# Patient Record
Sex: Male | Born: 1960 | Race: Black or African American | Hispanic: No | Marital: Married | Smoking: Current every day smoker
Health system: Southern US, Community
[De-identification: ages and names within clinical notes are randomized; demographics above are authoritative.]

## PROBLEM LIST (undated history)

## (undated) DIAGNOSIS — M545 Low back pain, unspecified: Secondary | ICD-10-CM

## (undated) DIAGNOSIS — I10 Essential (primary) hypertension: Secondary | ICD-10-CM

## (undated) DIAGNOSIS — E119 Type 2 diabetes mellitus without complications: Secondary | ICD-10-CM

## (undated) DIAGNOSIS — E785 Hyperlipidemia, unspecified: Secondary | ICD-10-CM

## (undated) DIAGNOSIS — K219 Gastro-esophageal reflux disease without esophagitis: Secondary | ICD-10-CM

## (undated) HISTORY — DX: Low back pain, unspecified: M54.50

## (undated) HISTORY — DX: Hyperlipidemia, unspecified: E78.5

## (undated) HISTORY — DX: Essential (primary) hypertension: I10

## (undated) HISTORY — DX: Low back pain: M54.5

---

## 2005-01-01 ENCOUNTER — Ambulatory Visit: Payer: Self-pay | Admitting: Internal Medicine

## 2005-08-16 ENCOUNTER — Ambulatory Visit: Payer: Self-pay | Admitting: Internal Medicine

## 2009-02-28 ENCOUNTER — Emergency Department: Payer: Self-pay | Admitting: Internal Medicine

## 2010-03-16 ENCOUNTER — Ambulatory Visit: Payer: Self-pay | Admitting: Internal Medicine

## 2011-02-02 ENCOUNTER — Other Ambulatory Visit: Payer: Self-pay

## 2011-03-15 ENCOUNTER — Ambulatory Visit: Payer: Self-pay

## 2012-12-31 ENCOUNTER — Emergency Department: Payer: Self-pay | Admitting: Emergency Medicine

## 2012-12-31 LAB — BASIC METABOLIC PANEL
BUN: 11 mg/dL (ref 7–18)
Glucose: 88 mg/dL (ref 65–99)
Osmolality: 276 (ref 275–301)

## 2012-12-31 LAB — URINALYSIS, COMPLETE
Ketone: NEGATIVE
Leukocyte Esterase: NEGATIVE
Ph: 6 (ref 4.5–8.0)
Protein: NEGATIVE
RBC,UR: <1 /HPF (ref 0–5)
WBC UR: <1 /HPF (ref 0–5)

## 2012-12-31 LAB — CBC
HGB: 14.1 g/dL (ref 13.0–18.0)
MCH: 28.1 pg (ref 26.0–34.0)
MCHC: 33.5 g/dL (ref 32.0–36.0)
RBC: 5.01 10*6/uL (ref 4.40–5.90)
RDW: 14 % (ref 11.5–14.5)
WBC: 8.5 10*3/uL (ref 3.8–10.6)

## 2012-12-31 LAB — TROPONIN I: Troponin-I: <0.02 ng/mL

## 2013-01-09 ENCOUNTER — Ambulatory Visit: Payer: Self-pay | Admitting: Adult Health

## 2013-01-11 ENCOUNTER — Ambulatory Visit: Payer: Self-pay | Admitting: General Practice

## 2013-02-01 ENCOUNTER — Encounter: Payer: Self-pay | Admitting: Pulmonary Disease

## 2013-02-05 ENCOUNTER — Institutional Professional Consult (permissible substitution): Payer: Self-pay | Admitting: Pulmonary Disease

## 2013-02-08 ENCOUNTER — Encounter: Payer: Self-pay | Admitting: Pulmonary Disease

## 2015-07-13 IMAGING — CR DG CHEST 2V
1 series · 2 of 2 positions shown · non-contrast
Comparison: none

REASON FOR EXAM: Fax results [id number] ? nodule R lung
COMMENTS:

PROCEDURE:     [facility] - KDXR CHEST PA (OR AP) AND LAT  - January 11, 2013  [DATE]
RESULT:     The lungs are clear. The heart and pulmonary vessels are normal.
The bony and mediastinal structures are unremarkable. There is no effusion.
There is no pneumothorax or evidence of congestive failure.

[Series 1: pa · 0.17mm/px · 2 of 2 slices shown]
[im 1/2]
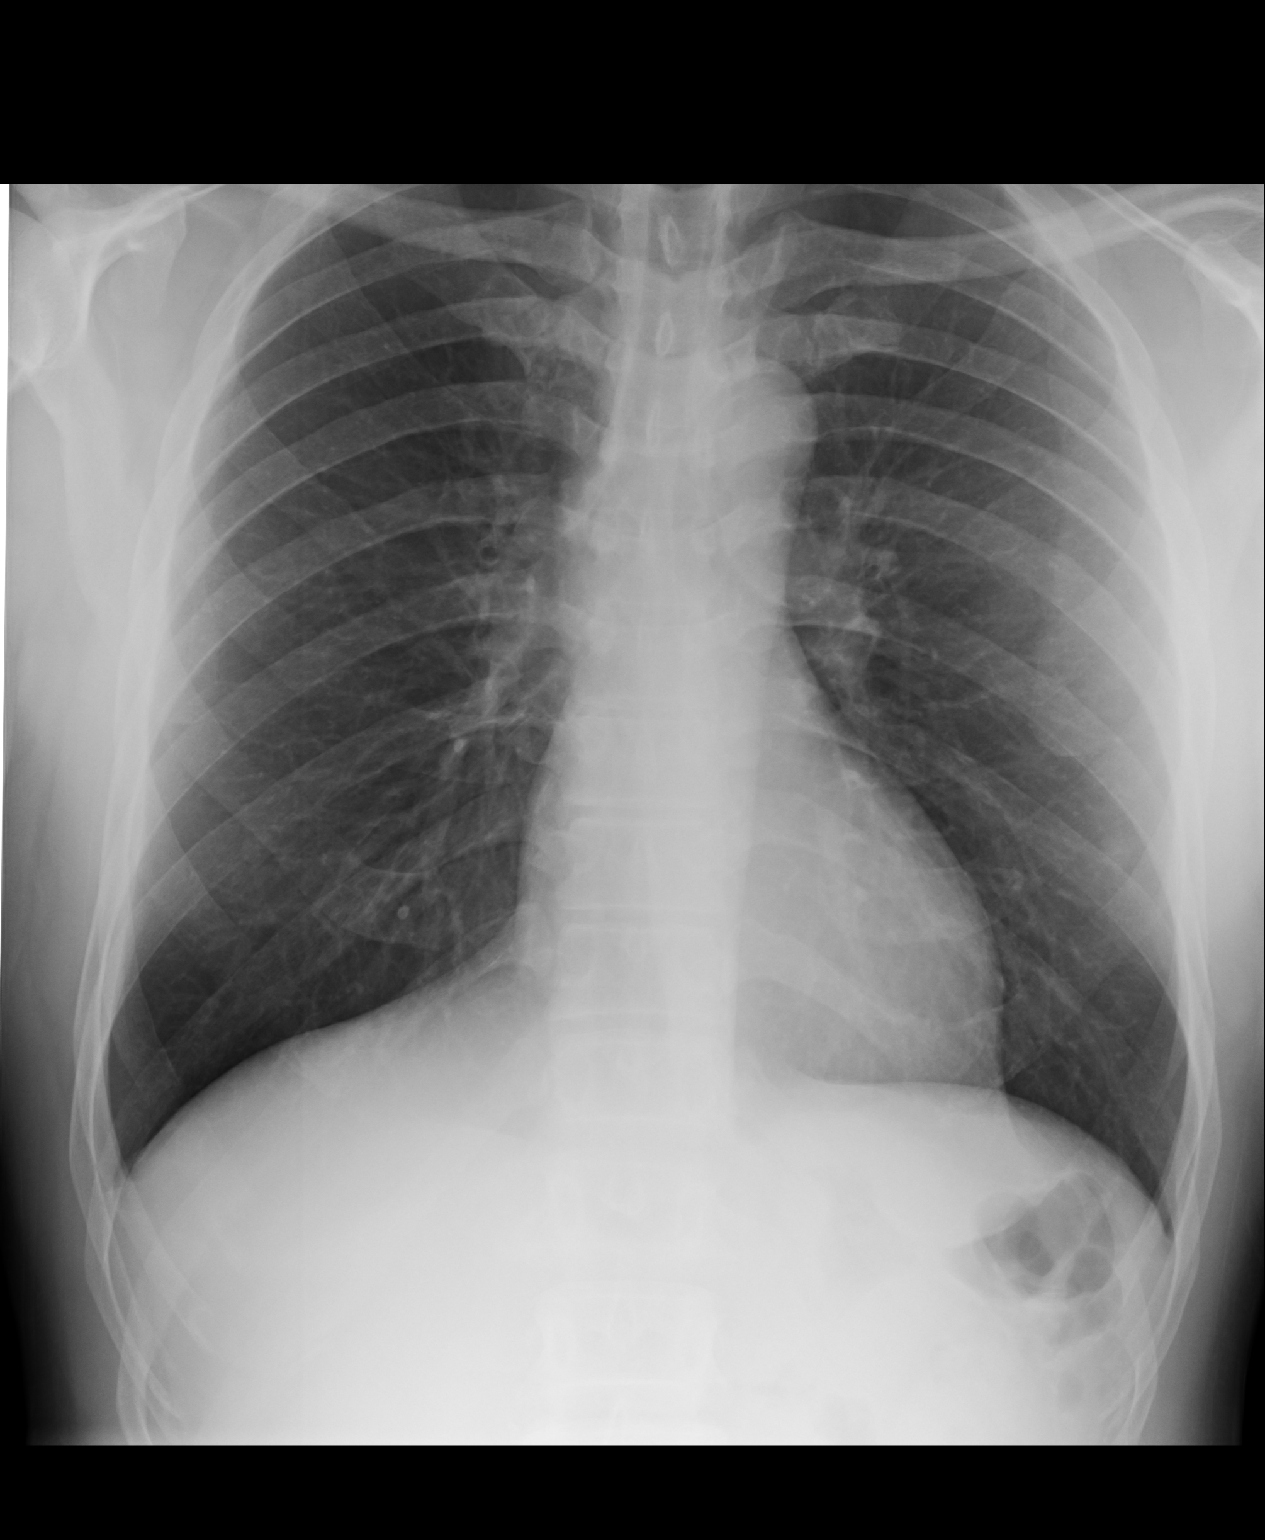
[im 2/2]
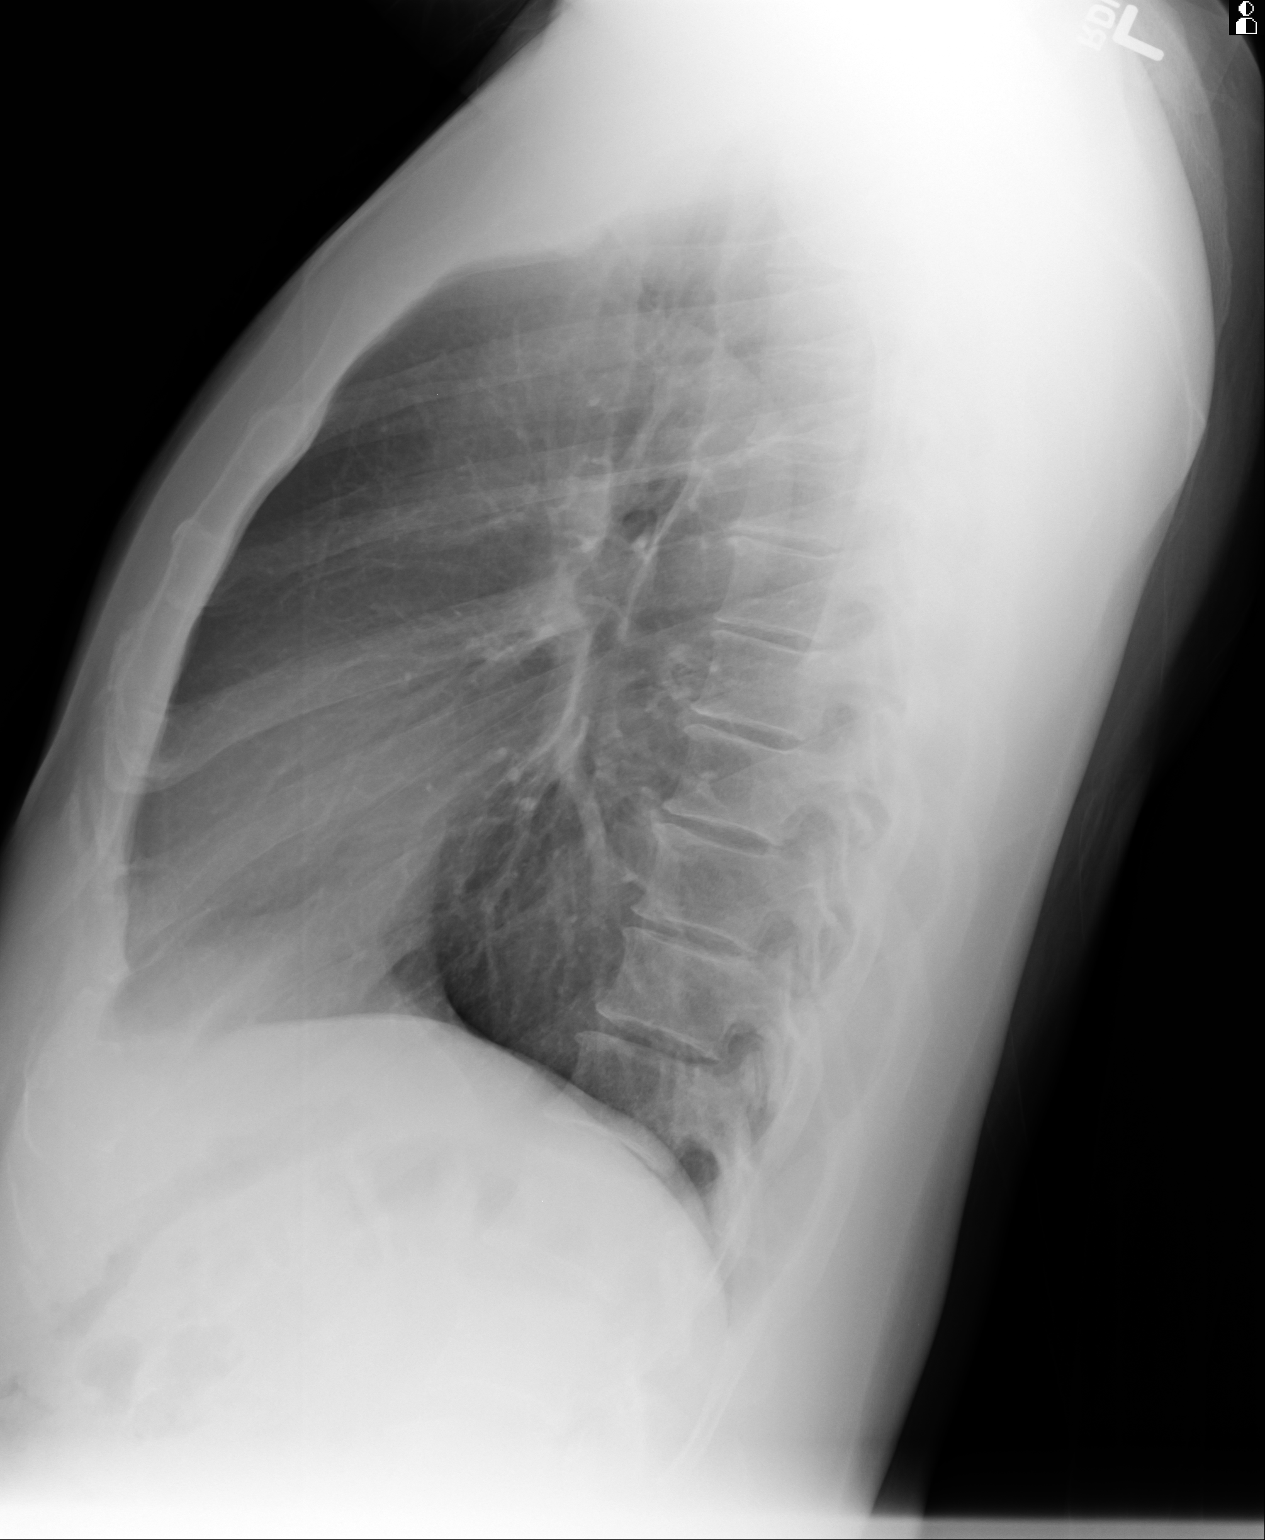

[2 of 2 positions shown; findings below may reference images not displayed]

IMPRESSION: No acute cardiopulmonary disease.

[REDACTED]

## 2016-04-13 ENCOUNTER — Ambulatory Visit (INDEPENDENT_AMBULATORY_CARE_PROVIDER_SITE_OTHER): Payer: BLUE CROSS/BLUE SHIELD | Admitting: Cardiology

## 2016-04-13 ENCOUNTER — Encounter (INDEPENDENT_AMBULATORY_CARE_PROVIDER_SITE_OTHER): Payer: Self-pay

## 2016-04-13 ENCOUNTER — Encounter: Payer: Self-pay | Admitting: Cardiology

## 2016-04-13 VITALS — BP 183/105 | HR 58 | Ht 75.0 in | Wt 188.0 lb

## 2016-04-13 DIAGNOSIS — I493 Ventricular premature depolarization: Secondary | ICD-10-CM

## 2016-04-13 DIAGNOSIS — I499 Cardiac arrhythmia, unspecified: Secondary | ICD-10-CM | POA: Diagnosis not present

## 2016-04-13 DIAGNOSIS — F17200 Nicotine dependence, unspecified, uncomplicated: Secondary | ICD-10-CM | POA: Diagnosis not present

## 2016-04-13 DIAGNOSIS — I10 Essential (primary) hypertension: Secondary | ICD-10-CM | POA: Diagnosis not present

## 2016-04-13 MED ORDER — HYDROCHLOROTHIAZIDE 12.5 MG PO CAPS
12.5000 mg | ORAL_CAPSULE | Freq: Every day | ORAL | 1 refills | Status: DC
Start: 2016-04-13 — End: 2016-04-13

## 2016-04-13 MED ORDER — LISINOPRIL 40 MG PO TABS
40.0000 mg | ORAL_TABLET | Freq: Every day | ORAL | 1 refills | Status: DC
Start: 2016-04-13 — End: 2016-04-13

## 2016-04-13 MED ORDER — LISINOPRIL 40 MG PO TABS: 40.0000 mg | ORAL_TABLET | Freq: Every day | ORAL | 1 refills | Status: DC

## 2016-04-13 MED ORDER — HYDROCHLOROTHIAZIDE 12.5 MG PO CAPS: 12.5000 mg | ORAL_CAPSULE | Freq: Every day | ORAL | 1 refills | Status: DC

## 2016-04-13 NOTE — Patient Instructions (Signed)
Medication Instructions:  Your physician has recommended you make the following change in your medication:  1. Refill of hydrochlorothiazide and lisinopril sent in. Any future refills will need to be obtained from your primary care provider.   Labwork: Your physician recommends that you return for lab work in: 1 week for repeat BMP   Testing/Procedures: Your physician has requested that you have an echocardiogram. Echocardiography is a painless test that uses sound waves to create images of your heart. It provides your doctor with information about the size and shape of your heart and how well your heart's chambers and valves are working. This procedure takes approximately one hour. There are no restrictions for this procedure.    Follow-Up: Your physician wants you to follow-up in: 6 months with Dr. Yvone Neu. You will receive a reminder letter in the mail two months in advance. If you don't receive a letter, please call our office to schedule the follow-up appointment.  It was a pleasure seeing you today here in the office. Please do not hesitate to give [place] a call back if you have any further questions. [place], BSN     Echocardiogram An echocardiogram, or echocardiography, uses sound waves (ultrasound) to produce an image of your heart. The echocardiogram is simple, painless, obtained within a short period of time, and offers valuable information to your health care provider. The images from an echocardiogram can provide information such as:  Evidence of coronary artery disease (CAD).  Heart size.  Heart muscle function.  Heart valve function.  Aneurysm detection.  Evidence of a past heart attack.  Fluid buildup around the heart.  Heart muscle thickening.  Assess heart valve function. LET [facility] CARE PROVIDER KNOW ABOUT:  Any allergies you have.  All medicines you are taking, including vitamins, herbs, eye drops, creams, and over-the-counter  medicines.  Previous problems you or members of your family have had with the use of anesthetics.  Any blood disorders you have.  Previous surgeries you have had.  Medical conditions you have.  Possibility of pregnancy, if this applies. BEFORE THE PROCEDURE  No special preparation is needed. Eat and drink normally.  PROCEDURE   In order to produce an image of your heart, gel will be applied to your chest and a wand-like tool (transducer) will be moved over your chest. The gel will help transmit the sound waves from the transducer. The sound waves will harmlessly bounce off your heart to allow the heart images to be captured in real-time motion. These images will then be recorded.  You may need an IV to receive a medicine that improves the quality of the pictures. AFTER THE PROCEDURE You may return to your normal schedule including diet, activities, and medicines, unless your health care provider tells you otherwise.   This information is not intended to replace advice given to you by your health care provider. Make sure you discuss any questions you have with your health care provider.   Document Released: 05/20/2000 Document Revised: 06/13/2014 Document Reviewed: 01/28/2013 Elsevier Interactive Patient Education [employer].

## 2016-04-13 NOTE — Progress Notes (Signed)
Cardiology Office Note   Date:  04/13/2016   ID:  Anthony Harding, DOB 1960-09-22, MRN [id number]  Referring Doctor:  Coral Spikes, DO   Cardiologist:   Wende Bushy, MD   Reason for consultation:  Chief Complaint  Patient presents with  . OTHER    Pt needs cardiac clearance for colonoscopy hx Bigemany and Elevated BP c/o rash. Meds reviewed verbally with pt.      History of Present Illness: Anthony Harding is a 55 y.o. male who presents for Preoperative cardiac evaluation prior to screening colonoscopy  Patient reports that he has had diagnoses of hypertension. He has been out of his medications for 1-2 months now. While he was taking his medications, his blood pressure was staying within the normal limits.  Patient also reports that he saw a cardiologist previously for ventricular bigeminy. He remembers undergoing some tests including an echocardiogram and Holter monitor. Patient was told that nothing significant was going on. He has not seen a cardiologist for quite a while now.  In terms of symptoms, patient denies chest pain, shortness of breath, palpitations, loss of consciousness. 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 He is able to lift almost 95+ pounds multiple times throughout his day without any chest pains or shortness of breath. He is able to climb up a flight of stairs at least one flight, without chest pain or shortness of breath.   ROS:  Please see the history of present illness. Aside from mentioned under HPI, all other systems are reviewed and negative.     Past Medical History:  Diagnosis Date  . Hypertension     History reviewed. No pertinent surgical history.   reports that he has been smoking.  He has a 15.00 pack-year smoking history. He has never used smokeless tobacco. He reports that he drinks alcohol. He reports that he does not use drugs.   family history includes Heart attack in his mother; Hypertension in his father and  mother.   Outpatient Medications Prior to Visit  Medication Sig Dispense Refill  . hydrochlorothiazide (MICROZIDE) 12.5 MG capsule Take 12.5 mg by mouth daily.    Marland Kitchen lisinopril (PRINIVIL,ZESTRIL) 40 MG tablet Take 40 mg by mouth daily.     No facility-administered medications prior to visit.      Allergies: Patient has no known allergies.    PHYSICAL EXAM: VS:  BP (!) 183/105 (BP Location: Left Arm, Patient Position: Sitting, Cuff Size: Normal)   Pulse (!) 58   Ht 6\' 3"  (1.905 m)   Wt 188 lb (85.3 kg)   BMI 23.50 kg/m  , Body mass index is 23.5 kg/m. Wt Readings from Last 3 Encounters:  04/13/16 188 lb (85.3 kg)    GENERAL:  well developed, well nourished, not in acute distress HEENT: normocephalic, pink conjunctivae, anicteric sclerae, no xanthelasma, normal dentition, oropharynx clear NECK:  no neck vein engorgement, JVP normal, no hepatojugular reflux, carotid upstroke brisk and symmetric, no bruit, no thyromegaly, no lymphadenopathy LUNGS:  good respiratory effort, clear to auscultation bilaterally CV:  PMI not displaced, no thrills, no lifts, S1 and S2 within normal limits, no palpable S3 or S4, no murmurs, no rubs, no gallops ABD:  Soft, nontender, nondistended, normoactive bowel sounds, no abdominal aortic bruit, no hepatomegaly, no splenomegaly MS: nontender back, no kyphosis, no scoliosis, no joint deformities EXT:  2+ DP/PT pulses, no edema, no varicosities, no cyanosis, no clubbing SKIN: warm, nondiaphoretic, normal turgor, no  ulcers NEUROPSYCH: alert, oriented to person, place, and time, sensory/motor grossly intact, normal mood, appropriate affect  Recent Labs: No results found for requested labs within last 8760 hours.   Lipid Panel No results found for: CHOL, TRIG, HDL, CHOLHDL, VLDL, LDLCALC, LDLDIRECT   Other studies Reviewed:  EKG:  The ekg from 04/13/2016 was personally reviewed by me and it revealed sinus rhythm, 50 BPM. Right axis deviation, pulmonary  disease pattern. Nonspecific ST-T wave changes. Abnormal EKG. When compared to EKG from 12/23/2012, aside from presence of multiple PVCs on previous EKG, EKG is essentially unchanged.   Additional studies/ records that were reviewed personally reviewed by me today include: None available Nuclear stress is 03/16/2010: 1.Normal treadmill EKG without evidence of ischemia or arrhythmia.   2.Average exercise tolerance for age.   3.Normal LV systolic function with ejection fraction of 55%.   4.Normal myocardial perfusion without evidence of myocardial ischemia.      ASSESSMENT AND PLAN: Hypertension We will send in prescription for hydrochlorothiazide and lisinopril. Patient is scheduled to establish care with PCP Dr. Lacinda Axon by the end of this month. Check BMP now and repeat in one week.  History of PVCs/ventricular bigeminy Need to obtain records from Dr. Nehemiah Massed, specifically that of a Holter monitor report. Recommend echocardiogram.  Preoperative cardiac evaluation Once blood pressure is controlled, do not foresee need for any cardiac testing. Patient's functional capacity is normal and is able to achieve at least 4 METs without any symptoms based on patient reporting/lack of symptoms. His EKG, discounting the multiple PVCs in previous EKG, is essentially unchanged from old EKG. His EF was within normal limits from an nuclear stress test October 2011. He does not have any evidence of congestive heart failure. Patient would likely be intermediate cardiac risk for colonoscopy.  Tobacco use We discussed the importance of smoking cessation and different strategies for quitting.    Current medicines are reviewed at length with the patient today.  The patient does not have concerns regarding medicines.  Labs/ tests ordered today include:  Orders Placed This Encounter  Procedures  . Basic metabolic panel  . Basic metabolic panel  . EKG 12-Lead  . ECHOCARDIOGRAM COMPLETE    I  had a lengthy and detailed discussion with the patient regarding diagnoses, prognosis, diagnostic options, treatment options , and side effects of medications.   I counseled the patient on importance of lifestyle modification including heart healthy diet, regular physical activity  , and smoking cessation.   Disposition:   FU with undersigned after tests  I spent at least 40 minutes with the patient today and more than 50% of the time was spent counseling the patient and coordinating care.     Signed, Wende Bushy, MD  04/13/2016 5:21 PM    [place]  This note was generated in part with voice recognition software and I apologize for any typographical errors that were not detected and corrected.

## 2016-04-14 ENCOUNTER — Other Ambulatory Visit: Payer: Self-pay

## 2016-04-14 DIAGNOSIS — I10 Essential (primary) hypertension: Secondary | ICD-10-CM

## 2016-04-14 DIAGNOSIS — I493 Ventricular premature depolarization: Secondary | ICD-10-CM

## 2016-04-14 LAB — BASIC METABOLIC PANEL
BUN/Creatinine Ratio: 7 — ABNORMAL LOW (ref 9–20)
BUN: 7 mg/dL (ref 6–24)
CHLORIDE: 100 mmol/L (ref 96–106)
CO2: 26 mmol/L (ref 18–29)
CREATININE: 1 mg/dL (ref 0.76–1.27)
Calcium: 9.5 mg/dL (ref 8.7–10.2)
GFR calc Af Amer: 96 mL/min/{1.73_m2} (ref >59)
GFR calc non Af Amer: 83 mL/min/{1.73_m2} (ref >59)
GLUCOSE: 85 mg/dL (ref 65–99)
Potassium: 4.1 mmol/L (ref 3.5–5.2)
SODIUM: 141 mmol/L (ref 134–144)

## 2016-04-14 MED ORDER — LISINOPRIL 40 MG PO TABS: 40.0000 mg | ORAL_TABLET | Freq: Every day | ORAL | 1 refills | Status: DC

## 2016-04-14 NOTE — Telephone Encounter (Signed)
Pt is at pharmacy now

## 2016-04-15 ENCOUNTER — Telehealth: Payer: Self-pay | Admitting: Cardiology

## 2016-04-15 DIAGNOSIS — I10 Essential (primary) hypertension: Secondary | ICD-10-CM

## 2016-04-15 DIAGNOSIS — I493 Ventricular premature depolarization: Secondary | ICD-10-CM

## 2016-04-15 MED ORDER — HYDROCHLOROTHIAZIDE 12.5 MG PO CAPS: 12.5000 mg | ORAL_CAPSULE | Freq: Every day | ORAL | 1 refills | Status: DC

## 2016-04-15 NOTE — Telephone Encounter (Signed)
Patients wife states that the HCTZ was not sent to the pharmacy. Reviewed medications and it was listed as print. Sent in prescription through Epic and notified her that it was sent over to them.

## 2016-04-18 ENCOUNTER — Other Ambulatory Visit: Payer: Self-pay | Admitting: Cardiology

## 2016-04-18 DIAGNOSIS — R9431 Abnormal electrocardiogram [ECG] [EKG]: Secondary | ICD-10-CM

## 2016-04-18 DIAGNOSIS — I493 Ventricular premature depolarization: Secondary | ICD-10-CM

## 2016-04-20 ENCOUNTER — Other Ambulatory Visit (INDEPENDENT_AMBULATORY_CARE_PROVIDER_SITE_OTHER): Payer: BLUE CROSS/BLUE SHIELD

## 2016-04-20 DIAGNOSIS — I10 Essential (primary) hypertension: Secondary | ICD-10-CM

## 2016-04-21 LAB — BASIC METABOLIC PANEL
BUN/Creatinine Ratio: 13 (ref 9–20)
BUN: 14 mg/dL (ref 6–24)
CALCIUM: 9.6 mg/dL (ref 8.7–10.2)
CO2: 27 mmol/L (ref 18–29)
CREATININE: 1.09 mg/dL (ref 0.76–1.27)
Chloride: 98 mmol/L (ref 96–106)
GFR calc Af Amer: 87 mL/min/{1.73_m2} (ref >59)
GFR, EST NON AFRICAN AMERICAN: 75 mL/min/{1.73_m2} (ref >59)
Glucose: 130 mg/dL — ABNORMAL HIGH (ref 65–99)
Potassium: 4.6 mmol/L (ref 3.5–5.2)
Sodium: 140 mmol/L (ref 134–144)

## 2016-05-02 ENCOUNTER — Ambulatory Visit: Payer: Self-pay | Admitting: Family Medicine

## 2016-05-03 ENCOUNTER — Ambulatory Visit (INDEPENDENT_AMBULATORY_CARE_PROVIDER_SITE_OTHER): Payer: BLUE CROSS/BLUE SHIELD | Admitting: Family Medicine

## 2016-05-03 ENCOUNTER — Encounter: Payer: Self-pay | Admitting: Family Medicine

## 2016-05-03 VITALS — BP 128/76 | HR 82 | Temp 98.6°F | Ht 72.0 in | Wt 185.4 lb

## 2016-05-03 DIAGNOSIS — Z0001 Encounter for general adult medical examination with abnormal findings: Secondary | ICD-10-CM | POA: Diagnosis not present

## 2016-05-03 DIAGNOSIS — R21 Rash and other nonspecific skin eruption: Secondary | ICD-10-CM | POA: Insufficient documentation

## 2016-05-03 DIAGNOSIS — Z1211 Encounter for screening for malignant neoplasm of colon: Secondary | ICD-10-CM | POA: Diagnosis not present

## 2016-05-03 DIAGNOSIS — Z23 Encounter for immunization: Secondary | ICD-10-CM

## 2016-05-03 DIAGNOSIS — I10 Essential (primary) hypertension: Secondary | ICD-10-CM | POA: Insufficient documentation

## 2016-05-03 DIAGNOSIS — N529 Male erectile dysfunction, unspecified: Secondary | ICD-10-CM | POA: Insufficient documentation

## 2016-05-03 DIAGNOSIS — K219 Gastro-esophageal reflux disease without esophagitis: Secondary | ICD-10-CM | POA: Insufficient documentation

## 2016-05-03 DIAGNOSIS — I493 Ventricular premature depolarization: Secondary | ICD-10-CM | POA: Insufficient documentation

## 2016-05-03 MED ORDER — LISINOPRIL 40 MG PO TABS: 40.0000 mg | ORAL_TABLET | Freq: Every day | ORAL | 3 refills | Status: DC

## 2016-05-03 MED ORDER — SILDENAFIL CITRATE 100 MG PO TABS: 50.0000 mg | ORAL_TABLET | Freq: Every day | ORAL | 11 refills | Status: DC | PRN

## 2016-05-03 MED ORDER — PANTOPRAZOLE SODIUM 40 MG PO TBEC
40.0000 mg | DELAYED_RELEASE_TABLET | Freq: Every day | ORAL | 1 refills | Status: DC
Start: 2016-05-03 — End: 2016-11-14

## 2016-05-03 MED ORDER — CLOTRIMAZOLE 1 % EX CREA: 1.0000 "application " | TOPICAL_CREAM | Freq: Two times a day (BID) | CUTANEOUS | 0 refills | Status: DC

## 2016-05-03 MED ORDER — HYDROCHLOROTHIAZIDE 12.5 MG PO CAPS: 12.5000 mg | ORAL_CAPSULE | Freq: Every day | ORAL | 3 refills | Status: DC

## 2016-05-03 NOTE — Assessment & Plan Note (Signed)
New problem. Likely secondary to chronic uncontrolled hypertension which is now well controlled. Trial of Viagra.

## 2016-05-03 NOTE — Assessment & Plan Note (Signed)
New problem. Treating with Protonix.

## 2016-05-03 NOTE — Assessment & Plan Note (Addendum)
Tdap today. Declines influenza. Screening labs today. Cutting back on smoking. Arranging for colonoscopy.

## 2016-05-03 NOTE — Assessment & Plan Note (Signed)
Well controlled. Continue lisinopril and HCTZ.

## 2016-05-03 NOTE — Progress Notes (Signed)
Subjective:  Patient ID: Anthony Harding, male    DOB: [birth year]  Age: 55 y.o. MRN: [id number]  CC: Establish care, physical exam, GERD, Rash, HTN, ED  HPI Anthony Harding is a 55 y.o. male presents to the clinic today to establish care.   Preventative Healthcare  Colonoscopy: In need of.  Immunizations  Tetanus - In need of.  Flu - Declines.  Prostate cancer screening: Screening today.  Labs: Screening labs today.  Exercise: No regular.  Alcohol use: See below.  Smoking/tobacco use: Current smoker. Has cut back to 1/day.  HTN  At goal on lisinopril and HCTZ. In need of refill.  GERD  Has been expansion significant heartburn recently.  No improvement with Tums.  No red flag symptoms.  He would like medication today.  Rash  Chronic.  Associated dryness and itching.  Located in the medial upper arms.  No known exacerbating or relieving factors.  No other associated symptoms.  ED  Patient reports issues with chronic erectile dysfunction.  He would like to discuss treatment options today.  PMH, Surgical Hx, Family Hx, Social History reviewed and updated as below.  Past Medical History:  Diagnosis Date  . Hypertension    Past Surgical History:  Procedure Laterality Date  . NO PAST SURGERIES     Family History  Problem Relation Age of Onset  . Hypertension Mother   . Heart attack Mother   . Hypertension Father    Social History  Substance Use Topics  . Smoking status: Current Every Day Smoker    Packs/day: 0.50    Years: 30.00  . Smokeless tobacco: Never Used  . Alcohol use Yes     Comment: socially   Review of Systems  Gastrointestinal:       GERD.  Genitourinary:       Erectile dysfunction.  Skin: Positive for rash.  All other systems reviewed and are negative.  Objective:   Today's Vitals: BP 128/76   Pulse 82   Temp 98.6 F (37 C) (Oral)   Ht 6' (1.829 m)   Wt 185 lb 6.4 oz (84.1 kg)   SpO2 96%   BMI 25.14 kg/m     Physical Exam  Constitutional: He is oriented to person, place, and time. He appears well-developed and well-nourished. No distress.  HENT:  Head: Normocephalic and atraumatic.  Nose: Nose normal.  Mouth/Throat: Oropharynx is clear and moist. No oropharyngeal exudate.  Normal TM's bilaterally.   Eyes: Conjunctivae are normal. No scleral icterus.  Neck: Neck supple.  Cardiovascular: Normal rate and regular rhythm.   No murmur heard. Pulmonary/Chest: Effort normal and breath sounds normal. He has no wheezes. He has no rales.  Abdominal: Soft. He exhibits no distension. There is no tenderness. There is no rebound and no guarding.  Musculoskeletal: Normal range of motion. He exhibits no edema.  Lymphadenopathy:    He has no cervical adenopathy.  Neurological: He is alert and oriented to person, place, and time.  Skin:  Dry, scaly rash noted on both upper medial arms.  Psychiatric: He has a normal mood and affect.  Vitals reviewed.  Assessment & Plan:   Problem List Items Addressed This Visit    Rash    New problem. Suspect underlying fungal infection. Treating with clotrimazole.      GERD (gastroesophageal reflux disease)    New problem. Treating with Protonix.      Relevant Medications   pantoprazole (PROTONIX) 40 MG tablet   Essential hypertension  Well controlled. Continue lisinopril and HCTZ.      Relevant Medications   lisinopril (PRINIVIL,ZESTRIL) 40 MG tablet   hydrochlorothiazide (MICROZIDE) 12.5 MG capsule   sildenafil (VIAGRA) 100 MG tablet   Encounter for health maintenance examination with abnormal findings - Primary    Tdap today. Declines influenza. Screening labs today. Cutting back on smoking. Arranging for colonoscopy.      Relevant Orders   CBC   Hemoglobin A1c   Lipid panel   PSA   ED (erectile dysfunction)    New problem. Likely secondary to chronic uncontrolled hypertension which is now well controlled. Trial of Viagra.        Other Visit Diagnoses    Need for prophylactic vaccination against diphtheria-tetanus-pertussis (DTP)       Relevant Orders   Tdap vaccine greater than or equal to 7yo IM (Completed)   Encounter for screening colonoscopy       Relevant Orders   Ambulatory referral to Gastroenterology      Outpatient Encounter Prescriptions as of 05/03/2016  Medication Sig  . hydrochlorothiazide (MICROZIDE) 12.5 MG capsule Take 1 capsule (12.5 mg total) by mouth daily.  Marland Kitchen lisinopril (PRINIVIL,ZESTRIL) 40 MG tablet Take 1 tablet (40 mg total) by mouth daily.  . [DISCONTINUED] hydrochlorothiazide (MICROZIDE) 12.5 MG capsule Take 1 capsule (12.5 mg total) by mouth daily.  . [DISCONTINUED] lisinopril (PRINIVIL,ZESTRIL) 40 MG tablet Take 1 tablet (40 mg total) by mouth daily.  . clotrimazole (LOTRIMIN) 1 % cream Apply 1 application topically 2 (two) times daily.  . pantoprazole (PROTONIX) 40 MG tablet Take 1 tablet (40 mg total) by mouth daily.  . sildenafil (VIAGRA) 100 MG tablet Take 0.5-1 tablets (50-100 mg total) by mouth daily as needed for erectile dysfunction. 1 hour prior to intercourse.   No facility-administered encounter medications on file as of 05/03/2016.     Follow-up: 6 months.   [place]

## 2016-05-03 NOTE — Assessment & Plan Note (Signed)
New problem. Suspect underlying fungal infection. Treating with clotrimazole.

## 2016-05-03 NOTE — Patient Instructions (Signed)
Medications as prescribed.  We will arrange your colonoscopy.  Follow up in 6 months.  Take care  Dr. Lacinda Axon   Health Maintenance, Male A healthy lifestyle and preventative care can promote health and wellness.  Maintain regular health, dental, and eye exams.  Eat a healthy diet. Foods like vegetables, fruits, whole grains, low-fat dairy products, and lean protein foods contain the nutrients you need and are low in calories. Decrease your intake of foods high in solid fats, added sugars, and salt. Get information about a proper diet from your health care provider, if necessary.  Regular physical exercise is one of the most important things you can do for your health. Most adults should get at least 150 minutes of moderate-intensity exercise (any activity that increases your heart rate and causes you to sweat) each week. In addition, most adults need muscle-strengthening exercises on 2 or more days a week.   Maintain a healthy weight. The body mass index (BMI) is a screening tool to identify possible weight problems. It provides an estimate of body fat based on height and weight. Your health care provider can find your BMI and can help you achieve or maintain a healthy weight. For males 20 years and older:  A BMI below 18.5 is considered underweight.  A BMI of 18.5 to 24.9 is normal.  A BMI of 25 to 29.9 is considered overweight.  A BMI of 30 and above is considered obese.  Maintain normal blood lipids and cholesterol by exercising and minimizing your intake of saturated fat. Eat a balanced diet with plenty of fruits and vegetables. Blood tests for lipids and cholesterol should begin at age 76 and be repeated every 5 years. If your lipid or cholesterol levels are high, you are over age 34, or you are at high risk for heart disease, you may need your cholesterol levels checked more frequently.Ongoing high lipid and cholesterol levels should be treated with medicines if diet and exercise  are not working.  If you smoke, find out from your health care provider how to quit. If you do not use tobacco, do not start.  Lung cancer screening is recommended for adults aged 30-80 years who are at high risk for developing lung cancer because of a history of smoking. A yearly low-dose CT scan of the lungs is recommended for people who have at least a 30-pack-year history of smoking and are current smokers or have quit within the past 15 years. A pack year of smoking is smoking an average of 1 pack of cigarettes a day for 1 year (for example, a 30-pack-year history of smoking could mean smoking 1 pack a day for 30 years or 2 packs a day for 15 years). Yearly screening should continue until the smoker has stopped smoking for at least 15 years. Yearly screening should be stopped for people who develop a health problem that would prevent them from having lung cancer treatment.  If you choose to drink alcohol, do not have more than 2 drinks per day. One drink is considered to be 12 oz (360 mL) of beer, 5 oz (150 mL) of wine, or 1.5 oz (45 mL) of liquor.  Avoid the use of street drugs. Do not share needles with anyone. Ask for help if you need support or instructions about stopping the use of drugs.  High blood pressure causes heart disease and increases the risk of stroke. High blood pressure is more likely to develop in:  People who have blood pressure in  the end of the normal range (100-139/85-89 mm Hg).  People who are overweight or obese.  People who are African American.  If you are 32-47 years of age, have your blood pressure checked every 3-5 years. If you are 31 years of age or older, have your blood pressure checked every year. You should have your blood pressure measured twice-once when you are at a hospital or clinic, and once when you are not at a hospital or clinic. Record the average of the two measurements. To check your blood pressure when you are not at a hospital or clinic, you  can use:  An automated blood pressure machine at a pharmacy.  A home blood pressure monitor.  If you are 85-42 years old, ask your health care provider if you should take aspirin to prevent heart disease.  Diabetes screening involves taking a blood sample to check your fasting blood sugar level. This should be done once every 3 years after age 38 if you are at a normal weight and without risk factors for diabetes. Testing should be considered at a younger age or be carried out more frequently if you are overweight and have at least 1 risk factor for diabetes.  Colorectal cancer can be detected and often prevented. Most routine colorectal cancer screening begins at the age of 71 and continues through age 110. However, your health care provider may recommend screening at an earlier age if you have risk factors for colon cancer. On a yearly basis, your health care provider may provide home test kits to check for hidden blood in the stool. A small camera at the end of a tube may be used to directly examine the colon (sigmoidoscopy or colonoscopy) to detect the earliest forms of colorectal cancer. Talk to your health care provider about this at age 49 when routine screening begins. A direct exam of the colon should be repeated every 5-10 years through age 5, unless early forms of precancerous polyps or small growths are found.  People who are at an increased risk for hepatitis B should be screened for this virus. You are considered at high risk for hepatitis B if:  You were born in a country where hepatitis B occurs often. Talk with your health care provider about which countries are considered high risk.  Your parents were born in a high-risk country and you have not received a shot to protect against hepatitis B (hepatitis B vaccine).  You have HIV or AIDS.  You use needles to inject street drugs.  You live with, or have sex with, someone who has hepatitis B.  You are a man who has sex with  other men (MSM).  You get hemodialysis treatment.  You take certain medicines for conditions like cancer, organ transplantation, and autoimmune conditions.  Hepatitis C blood testing is recommended for all people born from [birth year] through 1967 and any individual with known risk factors for hepatitis C.  Healthy men should no longer receive prostate-specific antigen (PSA) blood tests as part of routine cancer screening. Talk to your health care provider about prostate cancer screening.  Testicular cancer screening is not recommended for adolescents or adult males who have no symptoms. Screening includes self-exam, a health care provider exam, and other screening tests. Consult with your health care provider about any symptoms you have or any concerns you have about testicular cancer.  Practice safe sex. Use condoms and avoid high-risk sexual practices to reduce the spread of sexually transmitted infections (STIs).  You  should be screened for STIs, including gonorrhea and chlamydia if:  You are sexually active and are younger than 24 years.  You are older than 24 years, and your health care provider tells you that you are at risk for this type of infection.  Your sexual activity has changed since you were last screened, and you are at an increased risk for chlamydia or gonorrhea. Ask your health care provider if you are at risk.  If you are at risk of being infected with HIV, it is recommended that you take a prescription medicine daily to prevent HIV infection. This is called pre-exposure prophylaxis (PrEP). You are considered at risk if:  You are a man who has sex with other men (MSM).  You are a heterosexual man who is sexually active with multiple partners.  You take drugs by injection.  You are sexually active with a partner who has HIV.  Talk with your health care provider about whether you are at high risk of being infected with HIV. If you choose to begin PrEP, you should first be  tested for HIV. You should then be tested every 3 months for as long as you are taking PrEP.  Use sunscreen. Apply sunscreen liberally and repeatedly throughout the day. You should seek shade when your shadow is shorter than you. Protect yourself by wearing long sleeves, pants, a wide-brimmed hat, and sunglasses year round whenever you are outdoors.  Tell your health care provider of new moles or changes in moles, especially if there is a change in shape or color. Also, tell your health care provider if a mole is larger than the size of a pencil eraser.  A one-time screening for abdominal aortic aneurysm (AAA) and surgical repair of large AAAs by ultrasound is recommended for men aged 33-75 years who are current or former smokers.  Stay current with your vaccines (immunizations). This information is not intended to replace advice given to you by your health care provider. Make sure you discuss any questions you have with your health care provider. Document Released: 11/19/2007 Document Revised: 06/13/2014 Document Reviewed: 02/24/2015 Elsevier Interactive Patient Education  2017 [employer].

## 2016-05-04 LAB — CBC
HEMATOCRIT: 43.2 % (ref 39.0–52.0)
HEMOGLOBIN: 14.4 g/dL (ref 13.0–17.0)
MCHC: 33.2 g/dL (ref 30.0–36.0)
MCV: 83.9 fl (ref 78.0–100.0)
PLATELETS: 250 10*3/uL (ref 150.0–400.0)
RBC: 5.15 Mil/uL (ref 4.22–5.81)
RDW: 13.8 % (ref 11.5–15.5)
WBC: 9.7 10*3/uL (ref 4.0–10.5)

## 2016-05-04 LAB — HEMOGLOBIN A1C: Hgb A1c MFr Bld: 6.2 % (ref 4.6–6.5)

## 2016-05-04 LAB — LIPID PANEL
CHOL/HDL RATIO: 4
Cholesterol: 211 mg/dL — ABNORMAL HIGH (ref 0–200)
HDL: 47.5 mg/dL (ref >39.00)
LDL CALC: 125 mg/dL — AB (ref 0–99)
NONHDL: 163.57
Triglycerides: 191 mg/dL — ABNORMAL HIGH (ref 0.0–149.0)
VLDL: 38.2 mg/dL (ref 0.0–40.0)

## 2016-05-04 LAB — PSA: PSA: 0.44 ng/mL (ref 0.10–4.00)

## 2016-05-10 ENCOUNTER — Other Ambulatory Visit: Payer: Self-pay

## 2016-05-10 ENCOUNTER — Ambulatory Visit (INDEPENDENT_AMBULATORY_CARE_PROVIDER_SITE_OTHER): Payer: BLUE CROSS/BLUE SHIELD

## 2016-05-10 DIAGNOSIS — I493 Ventricular premature depolarization: Secondary | ICD-10-CM

## 2016-05-10 DIAGNOSIS — R9431 Abnormal electrocardiogram [ECG] [EKG]: Secondary | ICD-10-CM

## 2016-05-24 ENCOUNTER — Telehealth: Payer: Self-pay

## 2016-05-24 NOTE — Telephone Encounter (Signed)
LVM for patient to call office to schedule Colonoscopy.

## 2016-11-01 ENCOUNTER — Ambulatory Visit: Payer: BLUE CROSS/BLUE SHIELD | Admitting: Family Medicine

## 2016-11-01 DIAGNOSIS — Z0289 Encounter for other administrative examinations: Secondary | ICD-10-CM

## 2016-11-14 ENCOUNTER — Telehealth: Payer: Self-pay | Admitting: Family Medicine

## 2016-11-16 NOTE — Telephone Encounter (Signed)
Refill was sent on 11/14/2016 #90 0 refills

## 2016-11-16 NOTE — Telephone Encounter (Signed)
LMOM to notify patient

## 2016-11-16 NOTE — Telephone Encounter (Signed)
Pt called requesting a refill on this medication. Pt states that he does nto have any left. Please advise, thank you!  [place], [place]  Call pt @ [phone]

## 2016-12-15 ENCOUNTER — Ambulatory Visit (INDEPENDENT_AMBULATORY_CARE_PROVIDER_SITE_OTHER): Payer: BLUE CROSS/BLUE SHIELD | Admitting: Family Medicine

## 2016-12-15 ENCOUNTER — Encounter: Payer: Self-pay | Admitting: Family Medicine

## 2016-12-15 VITALS — BP 122/80 | HR 80 | Temp 98.8°F | Resp 16 | Wt 195.8 lb

## 2016-12-15 DIAGNOSIS — Z130 Encounter for screening for diseases of the blood and blood-forming organs and certain disorders involving the immune mechanism: Secondary | ICD-10-CM | POA: Diagnosis not present

## 2016-12-15 DIAGNOSIS — Z1211 Encounter for screening for malignant neoplasm of colon: Secondary | ICD-10-CM | POA: Diagnosis not present

## 2016-12-15 DIAGNOSIS — R7303 Prediabetes: Secondary | ICD-10-CM | POA: Insufficient documentation

## 2016-12-15 DIAGNOSIS — K219 Gastro-esophageal reflux disease without esophagitis: Secondary | ICD-10-CM | POA: Diagnosis not present

## 2016-12-15 DIAGNOSIS — B354 Tinea corporis: Secondary | ICD-10-CM | POA: Diagnosis not present

## 2016-12-15 DIAGNOSIS — Z114 Encounter for screening for human immunodeficiency virus [HIV]: Secondary | ICD-10-CM | POA: Diagnosis not present

## 2016-12-15 DIAGNOSIS — Z1159 Encounter for screening for other viral diseases: Secondary | ICD-10-CM

## 2016-12-15 DIAGNOSIS — Z125 Encounter for screening for malignant neoplasm of prostate: Secondary | ICD-10-CM | POA: Diagnosis not present

## 2016-12-15 DIAGNOSIS — E785 Hyperlipidemia, unspecified: Secondary | ICD-10-CM

## 2016-12-15 DIAGNOSIS — I10 Essential (primary) hypertension: Secondary | ICD-10-CM | POA: Diagnosis not present

## 2016-12-15 MED ORDER — FLUCONAZOLE 150 MG PO TABS: 150.0000 mg | ORAL_TABLET | ORAL | 0 refills | Status: DC

## 2016-12-15 MED ORDER — KETOCONAZOLE 2 % EX CREA: 1.0000 "application " | TOPICAL_CREAM | Freq: Every day | CUTANEOUS | 0 refills | Status: DC

## 2016-12-15 NOTE — Assessment & Plan Note (Signed)
Treating with ketoconazole and Diflucan.

## 2016-12-15 NOTE — Assessment & Plan Note (Signed)
At goal on lisinopril/HCTZ.

## 2016-12-15 NOTE — Progress Notes (Signed)
Subjective:  Patient ID: Anthony Harding, male    DOB: 1961-04-28  Age: 56 y.o. MRN: [id number]  CC: Follow up  HPI:  56 year old male with hypertension, hyperlipidemia, prediabetes, GERD presents for follow-up.  HTN  At goal lisinopril, HCTZ.  HLD  Uncontrolled and untreated.  Previously recommended statin.  Needs lipid panel today.  GERD  Stable on Protonix.  Tinea corporis  Patient has had this previously.  He currently has a significant area located on his left flank/lower abdomen.  Dry and itchy.  He's used topical cream without symptom improvement.  Social Hx   Social History   Social History  . Marital status: Married    Spouse name: N/A  . Number of children: N/A  . Years of education: N/A   Social History Main Topics  . Smoking status: Current Every Day Smoker    Packs/day: 0.50    Years: 30.00  . Smokeless tobacco: Never Used  . Alcohol use Yes     Comment: socially  . Drug use: No  . Sexual activity: Not Asked   Other Topics Concern  . None   Social History Narrative  . None    Review of Systems  Constitutional: Negative.   Skin: Positive for rash.   Objective:  BP 122/80   Pulse 80   Temp 98.8 F (37.1 C) (Oral)   Resp 16   Wt 195 lb 12.8 oz (88.8 kg)   SpO2 98%   BMI 26.56 kg/m   BP/Weight 12/15/2016 05/03/2016 40/02/8118  Systolic BP 147 829 562  Diastolic BP 80 76 130  Wt. (Lbs) 195.8 185.4 188  BMI 26.56 25.14 23.5    Physical Exam  Constitutional: He is oriented to person, place, and time. He appears well-developed. No distress.  Cardiovascular: Normal rate and regular rhythm.   No murmur heard. Pulmonary/Chest: Effort normal. He has no wheezes. He has no rales.  Neurological: He is alert and oriented to person, place, and time.  Skin:  Dry patches with mild scale noted left flank/left lower abdomen.  Psychiatric: He has a normal mood and affect.  Vitals reviewed.   Lab Results  Component Value Date   WBC 9.7 05/03/2016   HGB 14.4 05/03/2016   HCT 43.2 05/03/2016   PLT 250.0 05/03/2016   GLUCOSE 130 (H) 04/20/2016   CHOL 211 (H) 05/03/2016   TRIG 191.0 (H) 05/03/2016   HDL 47.50 05/03/2016   LDLCALC 125 (H) 05/03/2016   NA 140 04/20/2016   K 4.6 04/20/2016   CL 98 04/20/2016   CREATININE 1.09 04/20/2016   BUN 14 04/20/2016   CO2 27 04/20/2016   PSA 0.44 05/03/2016   HGBA1C 6.2 05/03/2016    Assessment & Plan:   Problem List Items Addressed This Visit      Cardiovascular and Mediastinum   Essential hypertension - Primary    At goal on lisinopril/HCTZ.      Relevant Orders   Comprehensive metabolic panel     Digestive   GERD (gastroesophageal reflux disease)    Stable on Protonix. Continue.        Musculoskeletal and Integument   Tinea corporis    Treating with ketoconazole and Diflucan.      Relevant Medications   fluconazole (DIFLUCAN) 150 MG tablet   ketoconazole (NIZORAL) 2 % cream     Other   Hyperlipidemia    Uncontrolled and untreated. Lipid panel today.      Relevant Orders   Lipid panel  Prediabetes   Relevant Orders   Hemoglobin A1c    Other Visit Diagnoses    Screening for deficiency anemia       Relevant Orders   CBC   Prostate cancer screening       Relevant Orders   PSA   Colon cancer screening       Relevant Orders   Ambulatory referral to Gastroenterology   Need for hepatitis C screening test       Relevant Orders   Hepatitis C Antibody   Screening for HIV (human immunodeficiency virus)       Relevant Orders   HIV antibody      Meds ordered this encounter  Medications  . fluconazole (DIFLUCAN) 150 MG tablet    Sig: Take 1 tablet (150 mg total) by mouth once a week. For 4 weeks.    Dispense:  4 tablet    Refill:  0  . ketoconazole (NIZORAL) 2 % cream    Sig: Apply 1 application topically daily.    Dispense:  60 g    Refill:  0     Follow-up: annually  [place]

## 2016-12-15 NOTE — Patient Instructions (Signed)
We will call with the appt for the GI doctor.  Medications as prescribed.  Follow up in 1 year.  Take care  Dr. Lacinda Axon

## 2016-12-15 NOTE — Assessment & Plan Note (Signed)
Stable on Protonix.  Continue. 

## 2016-12-15 NOTE — Assessment & Plan Note (Signed)
Uncontrolled and untreated. Lipid panel today.

## 2016-12-16 LAB — LIPID PANEL
Cholesterol: 214 mg/dL — ABNORMAL HIGH (ref 0–200)
HDL: 47.2 mg/dL (ref >39.00)
LDL Cholesterol: 142 mg/dL — ABNORMAL HIGH (ref 0–99)
NONHDL: 166.5
Total CHOL/HDL Ratio: 5
Triglycerides: 125 mg/dL (ref 0.0–149.0)
VLDL: 25 mg/dL (ref 0.0–40.0)

## 2016-12-16 LAB — PSA: PSA: 0.56 ng/mL (ref 0.10–4.00)

## 2016-12-16 LAB — COMPREHENSIVE METABOLIC PANEL
ALBUMIN: 4.6 g/dL (ref 3.5–5.2)
ALK PHOS: 69 U/L (ref 39–117)
ALT: 13 U/L (ref 0–53)
AST: 17 U/L (ref 0–37)
BILIRUBIN TOTAL: 0.4 mg/dL (ref 0.2–1.2)
BUN: 16 mg/dL (ref 6–23)
CALCIUM: 10 mg/dL (ref 8.4–10.5)
CO2: 27 mEq/L (ref 19–32)
Chloride: 103 mEq/L (ref 96–112)
Creatinine, Ser: 1.37 mg/dL (ref 0.40–1.50)
GFR: 68.68 mL/min (ref >60.00)
GLUCOSE: 92 mg/dL (ref 70–99)
POTASSIUM: 3.9 meq/L (ref 3.5–5.1)
Sodium: 139 mEq/L (ref 135–145)
Total Protein: 7.3 g/dL (ref 6.0–8.3)

## 2016-12-16 LAB — CBC
HCT: 40.8 % (ref 39.0–52.0)
HEMOGLOBIN: 13.8 g/dL (ref 13.0–17.0)
MCHC: 33.8 g/dL (ref 30.0–36.0)
MCV: 84.2 fl (ref 78.0–100.0)
PLATELETS: 219 10*3/uL (ref 150.0–400.0)
RBC: 4.85 Mil/uL (ref 4.22–5.81)
RDW: 13.9 % (ref 11.5–15.5)
WBC: 7.1 10*3/uL (ref 4.0–10.5)

## 2016-12-16 LAB — HIV ANTIBODY (ROUTINE TESTING W REFLEX): HIV 1&2 Ab, 4th Generation: NONREACTIVE

## 2016-12-16 LAB — HEPATITIS C ANTIBODY: HCV AB: NEGATIVE

## 2016-12-16 LAB — HEMOGLOBIN A1C: Hgb A1c MFr Bld: 6.4 % (ref 4.6–6.5)

## 2016-12-20 ENCOUNTER — Encounter: Payer: Self-pay | Admitting: *Deleted

## 2016-12-22 ENCOUNTER — Telehealth: Payer: Self-pay | Admitting: Family Medicine

## 2016-12-22 NOTE — Telephone Encounter (Signed)
Spoke with the patient, see result note. thanks

## 2016-12-22 NOTE — Telephone Encounter (Signed)
Pt called back stating that medication was supposed to be called in for him. Pt states that we spoke with him 8 am and why has this not been take care of yet, advised pt that Dr. Lacinda Axon is in seeing patients. I advised pt that we would call him as soon as we send it over to the pharmacy. Please advise, thank you!  Call pt @ [place], [place]

## 2016-12-22 NOTE — Telephone Encounter (Signed)
Pt called back returning your call. Pt requested if you can call him back at 12pm. Thank you!

## 2016-12-22 NOTE — Telephone Encounter (Signed)
Patient advised that we would call him as soon script called in.

## 2016-12-23 ENCOUNTER — Other Ambulatory Visit: Payer: Self-pay | Admitting: Family Medicine

## 2016-12-23 MED ORDER — ROSUVASTATIN CALCIUM 20 MG PO TABS: 20.0000 mg | ORAL_TABLET | Freq: Every day | ORAL | 3 refills | Status: DC

## 2016-12-23 NOTE — Telephone Encounter (Signed)
Left voice advising script called in .

## 2017-01-02 ENCOUNTER — Other Ambulatory Visit: Payer: Self-pay

## 2017-01-02 ENCOUNTER — Telehealth: Payer: Self-pay

## 2017-01-02 DIAGNOSIS — Z1211 Encounter for screening for malignant neoplasm of colon: Secondary | ICD-10-CM

## 2017-01-02 NOTE — Telephone Encounter (Signed)
Gastroenterology Pre-Procedure Review  Request Date: ** Requesting Physician: Dr.   PATIENT REVIEW QUESTIONS: The patient responded to the following health history questions as indicated:    1. Are you having any GI issues? no 2. Do you have a personal history of Polyps? no 3. Do you have a family history of Colon Cancer or Polyps? yes (Father colon cancer) 4. Diabetes Mellitus? no 5. Joint replacements in the past 12 months?no 6. Major health problems in the past 3 months?no 7. Any artificial heart valves, MVP, or defibrillator?no    MEDICATIONS & ALLERGIES:    Patient reports the following regarding taking any anticoagulation/antiplatelet therapy:   Plavix, Coumadin, Eliquis, Xarelto, Lovenox, Pradaxa, Brilinta, or Effient? no Aspirin? no  Patient confirms/reports the following medications:  Current Outpatient Prescriptions  Medication Sig Dispense Refill  . fluconazole (DIFLUCAN) 150 MG tablet Take 1 tablet (150 mg total) by mouth once a week. For 4 weeks. 4 tablet 0  . hydrochlorothiazide (MICROZIDE) 12.5 MG capsule Take 1 capsule (12.5 mg total) by mouth daily. 90 capsule 3  . ketoconazole (NIZORAL) 2 % cream Apply 1 application topically daily. 60 g 0  . lisinopril (PRINIVIL,ZESTRIL) 40 MG tablet Take 1 tablet (40 mg total) by mouth daily. 90 tablet 3  . pantoprazole (PROTONIX) 40 MG tablet take 1 tablet by mouth once daily 90 tablet 0  . rosuvastatin (CRESTOR) 20 MG tablet Take 1 tablet (20 mg total) by mouth daily. 90 tablet 3  . sildenafil (VIAGRA) 100 MG tablet Take 0.5-1 tablets (50-100 mg total) by mouth daily as needed for erectile dysfunction. 1 hour prior to intercourse. 5 tablet 11   No current facility-administered medications for this visit.     Patient confirms/reports the following allergies:  No Known Allergies  No orders of the defined types were placed in this encounter.   AUTHORIZATION INFORMATION Primary Insurance: 1D#: Group #:  Secondary  Insurance: 1D#: Group #:  SCHEDULE INFORMATION: Date: 01/31/17 Time: Location: [place]

## 2017-01-06 NOTE — Care Management Important Message (Signed)
Important Message  Patient Details  Name: Anthony Harding MRN: [id number] Date of Birth: 29-Aug-1960   Medicare Important Message Given:  Yes    [name] 01/06/2017, 12:46 PM

## 2017-01-31 ENCOUNTER — Ambulatory Visit: Payer: BLUE CROSS/BLUE SHIELD | Admitting: Anesthesiology

## 2017-01-31 ENCOUNTER — Encounter
Admission: RE | Disposition: A | Discharge status: Home / Self Care | Payer: Self-pay | Source: Ambulatory Visit | Attending: Gastroenterology

## 2017-01-31 ENCOUNTER — Ambulatory Visit
Admission: RE | Admit: 2017-01-31 | Discharge: 2017-01-31 | Disposition: A | Discharge status: Home / Self Care | Payer: BLUE CROSS/BLUE SHIELD | Source: Ambulatory Visit | Attending: Gastroenterology | Admitting: Gastroenterology

## 2017-01-31 DIAGNOSIS — K219 Gastro-esophageal reflux disease without esophagitis: Secondary | ICD-10-CM | POA: Insufficient documentation

## 2017-01-31 DIAGNOSIS — Z8249 Family history of ischemic heart disease and other diseases of the circulatory system: Secondary | ICD-10-CM | POA: Diagnosis not present

## 2017-01-31 DIAGNOSIS — D123 Benign neoplasm of transverse colon: Secondary | ICD-10-CM

## 2017-01-31 DIAGNOSIS — Z1211 Encounter for screening for malignant neoplasm of colon: Secondary | ICD-10-CM | POA: Diagnosis present

## 2017-01-31 DIAGNOSIS — D125 Benign neoplasm of sigmoid colon: Secondary | ICD-10-CM | POA: Diagnosis not present

## 2017-01-31 DIAGNOSIS — Z79899 Other long term (current) drug therapy: Secondary | ICD-10-CM | POA: Diagnosis not present

## 2017-01-31 DIAGNOSIS — Z800 Family history of malignant neoplasm of digestive organs: Secondary | ICD-10-CM | POA: Insufficient documentation

## 2017-01-31 DIAGNOSIS — F17200 Nicotine dependence, unspecified, uncomplicated: Secondary | ICD-10-CM | POA: Diagnosis not present

## 2017-01-31 DIAGNOSIS — I10 Essential (primary) hypertension: Secondary | ICD-10-CM | POA: Insufficient documentation

## 2017-01-31 DIAGNOSIS — Z0000 Encounter for general adult medical examination without abnormal findings: Secondary | ICD-10-CM

## 2017-01-31 DIAGNOSIS — K635 Polyp of colon: Secondary | ICD-10-CM

## 2017-01-31 HISTORY — PX: COLONOSCOPY WITH PROPOFOL: SHX5780

## 2017-01-31 HISTORY — DX: Gastro-esophageal reflux disease without esophagitis: K21.9

## 2017-01-31 SURGERY — COLONOSCOPY WITH PROPOFOL
Anesthesia: General

## 2017-01-31 MED ORDER — PHENYLEPHRINE HCL 10 MG/ML IJ SOLN
INTRAMUSCULAR | Status: DC | PRN
Start: 2017-01-31 — End: 2017-01-31
  Administered 2017-01-31: 100 ug via INTRAVENOUS

## 2017-01-31 MED ORDER — PROPOFOL 10 MG/ML IV BOLUS
INTRAVENOUS | Status: DC | PRN
  Administered 2017-01-31: 100 mg via INTRAVENOUS

## 2017-01-31 MED ORDER — PROPOFOL 500 MG/50ML IV EMUL
INTRAVENOUS | Status: AC
  Filled 2017-01-31: qty 50

## 2017-01-31 MED ORDER — SODIUM CHLORIDE 0.9 % IV SOLN
INTRAVENOUS | Status: DC
  Administered 2017-01-31 (×2): via INTRAVENOUS

## 2017-01-31 MED ORDER — LIDOCAINE 2% (20 MG/ML) 5 ML SYRINGE
INTRAMUSCULAR | Status: DC | PRN
  Administered 2017-01-31: 40 mg via INTRAVENOUS

## 2017-01-31 MED ORDER — PROPOFOL 500 MG/50ML IV EMUL
INTRAVENOUS | Status: DC | PRN
  Administered 2017-01-31: 200 ug/kg/min via INTRAVENOUS

## 2017-01-31 NOTE — Anesthesia Postprocedure Evaluation (Signed)
Anesthesia Post Note  Patient: Anthony Harding  Procedure(s) Performed: Procedure(s) (LRB): COLONOSCOPY WITH PROPOFOL (N/A)  Patient location during evaluation: Endoscopy Anesthesia Type: General Level of consciousness: awake and alert Pain management: pain level controlled Vital Signs Assessment: post-procedure vital signs reviewed and stable Respiratory status: spontaneous breathing, nonlabored ventilation, respiratory function stable and patient connected to nasal cannula oxygen Cardiovascular status: blood pressure returned to baseline and stable Postop Assessment: no signs of nausea or vomiting Anesthetic complications: no     Last Vitals:  Vitals:   01/31/17 0946 01/31/17 0956  BP: 118/72 113/82  Pulse: 63 (!) 58  Resp: 17 (!) 22  Temp:    SpO2: 100% 99%    Last Pain:  Vitals:   01/31/17 0925  TempSrc:   PainSc: Asleep                 Martha Clan

## 2017-01-31 NOTE — Anesthesia Post-op Follow-up Note (Signed)
Anesthesia QCDR form completed.        

## 2017-01-31 NOTE — H&P (Signed)
   Lucilla Lame, MD [facility] [address] [place], [place] [address] Phone: [phone] Fax : [phone]  Primary Care Physician:  Coral Spikes, DO Primary Gastroenterologist:  Dr. Allen Norris  Pre-Procedure History & Physical: HPI:  Anthony Harding is a 56 y.o. male is here for a screening colonoscopy.   Past Medical History:  Diagnosis Date  . GERD (gastroesophageal reflux disease)   . Hypertension     Past Surgical History:  Procedure Laterality Date  . NO PAST SURGERIES      Prior to Admission medications   Medication Sig Start Date End Date Taking? Authorizing Provider  hydrochlorothiazide (MICROZIDE) 12.5 MG capsule Take 1 capsule (12.5 mg total) by mouth daily. 05/03/16  Yes Cook, Jayce G, DO  lisinopril (PRINIVIL,ZESTRIL) 40 MG tablet Take 1 tablet (40 mg total) by mouth daily. 05/03/16  Yes Cook, Jayce G, DO  pantoprazole (PROTONIX) 40 MG tablet take 1 tablet by mouth once daily 11/14/16  Yes Cook, Jayce G, DO  rosuvastatin (CRESTOR) 20 MG tablet Take 1 tablet (20 mg total) by mouth daily. 12/23/16  Yes Cook, Jayce G, DO  fluconazole (DIFLUCAN) 150 MG tablet Take 1 tablet (150 mg total) by mouth once a week. For 4 weeks. Patient not taking: Reported on 01/31/2017 12/15/16   Coral Spikes, DO  ketoconazole (NIZORAL) 2 % cream Apply 1 application topically daily. Patient not taking: Reported on 01/31/2017 12/15/16   Coral Spikes, DO  sildenafil (VIAGRA) 100 MG tablet Take 0.5-1 tablets (50-100 mg total) by mouth daily as needed for erectile dysfunction. 1 hour prior to intercourse. 05/03/16   Coral Spikes, DO    Allergies as of 01/02/2017  . (No Known Allergies)    Family History  Problem Relation Age of Onset  . Hypertension Mother   . Heart attack Mother   . Hypertension Father     Social History   Social History  . Marital status: Married    Spouse name: N/A  . Number of children: N/A  . Years of education: N/A   Occupational History  . Not on file.    Social History Main Topics  . Smoking status: Current Every Day Smoker    Packs/day: 0.50    Years: 30.00  . Smokeless tobacco: Never Used  . Alcohol use Yes     Comment: socially  . Drug use: No  . Sexual activity: Not on file   Other Topics Concern  . Not on file   Social History Narrative  . No narrative on file    Review of Systems: See HPI, otherwise negative ROS  Physical Exam: BP (!) 141/75   Pulse 74   Temp (!) 97 F (36.1 C) (Tympanic)   Resp 18   Ht 6\' 2"  (1.88 m)   Wt 190 lb (86.2 kg)   SpO2 98%   BMI 24.39 kg/m  General:   Alert,  pleasant and cooperative in NAD Head:  Normocephalic and atraumatic. Neck:  Supple; no masses or thyromegaly. Lungs:  Clear throughout to auscultation.    Heart:  Regular rate and rhythm. Abdomen:  Soft, nontender and nondistended. Normal bowel sounds, without guarding, and without rebound.   Neurologic:  Alert and  oriented x4;  grossly normal neurologically.  Impression/Plan: ASCENCION COYE is now here to undergo a screening colonoscopy.  Risks, benefits, and alternatives regarding colonoscopy have been reviewed with the patient.  Questions have been answered.  All parties agreeable.

## 2017-01-31 NOTE — Transfer of Care (Signed)
Immediate Anesthesia Transfer of Care Note  Patient: Anthony Harding  Procedure(s) Performed: Procedure(s): COLONOSCOPY WITH PROPOFOL (N/A)  Patient Location: PACU and Endoscopy Unit  Anesthesia Type:General  Level of Consciousness: sedated  Airway & Oxygen Therapy: Patient Spontanous Breathing and Patient connected to nasal cannula oxygen  Post-op Assessment: Report given to RN and Post -op Vital signs reviewed and stable  Post vital signs: Reviewed and stable  Last Vitals:  Vitals:   01/31/17 0756  BP: (!) 141/75  Pulse: 74  Resp: 18  Temp: (!) 36.1 C  SpO2: 98%    Last Pain:  Vitals:   01/31/17 0756  TempSrc: Tympanic         Complications: No apparent anesthesia complications

## 2017-01-31 NOTE — Anesthesia Preprocedure Evaluation (Signed)
Anesthesia Evaluation  Patient identified by MRN, date of birth, ID band Patient awake    Reviewed: Allergy & Precautions, H&P , NPO status , Patient's Chart, lab work & pertinent test results, reviewed documented beta blocker date and time   History of Anesthesia Complications Negative for: history of anesthetic complications  Airway Mallampati: I  TM Distance: >3 FB Neck ROM: full    Dental  (+) Edentulous Upper, Edentulous Lower   Pulmonary neg shortness of breath, neg sleep apnea, neg COPD, neg recent URI, Current Smoker,           Cardiovascular Exercise Tolerance: Good hypertension, (-) angina(-) CAD, (-) Past MI, (-) Cardiac Stents and (-) CABG + dysrhythmias (h/o bigeminy) (-) Valvular Problems/Murmurs     Neuro/Psych negative neurological ROS  negative psych ROS   GI/Hepatic Neg liver ROS, GERD  ,  Endo/Other  negative endocrine ROS  Renal/GU negative Renal ROS  negative genitourinary   Musculoskeletal   Abdominal   Peds  Hematology negative hematology ROS (+)   Anesthesia Other Findings Past Medical History: No date: GERD (gastroesophageal reflux disease) No date: Hypertension   Reproductive/Obstetrics negative OB ROS                             Anesthesia Physical Anesthesia Plan  ASA: II  Anesthesia Plan: General   Post-op Pain Management:    Induction: Intravenous  PONV Risk Score and Plan: 1 and Propofol infusion  Airway Management Planned: Natural Airway and Nasal Cannula  Additional Equipment:   Intra-op Plan:   Post-operative Plan:   Informed Consent: I have reviewed the patients History and Physical, chart, labs and discussed the procedure including the risks, benefits and alternatives for the proposed anesthesia with the patient or authorized representative who has indicated his/her understanding and acceptance.   Dental Advisory Given  Plan Discussed  with: Anesthesiologist, CRNA and Surgeon  Anesthesia Plan Comments:         Anesthesia Quick Evaluation

## 2017-01-31 NOTE — Op Note (Signed)
[facility] Patient Name: Anthony Harding Procedure Date: 01/31/2017 9:01 AM MRN: [id number] Account #: [id number] Date of Birth: [birth year] Admit Type: Outpatient Age: 56 Room: [facility] ENDO ROOM 4 Gender: Male Note Status: Finalized Procedure:            Colonoscopy Indications:          Screening for colorectal malignant neoplasm Providers:            Lucilla Lame MD, MD Referring MD:         Barnie Del. Lacinda Axon MD, MD (Referring MD) Medicines:            Propofol per Anesthesia Complications:        No immediate complications. Procedure:            Pre-Anesthesia Assessment:                       - Prior to the procedure, a History and Physical was                        performed, and patient medications and allergies were                        reviewed. The patient's tolerance of previous                        anesthesia was also reviewed. The risks and benefits of                        the procedure and the sedation options and risks were                        discussed with the patient. All questions were                        answered, and informed consent was obtained. Prior                        Anticoagulants: The patient has taken no previous                        anticoagulant or antiplatelet agents. ASA Grade                        Assessment: II - A patient with mild systemic disease.                        After reviewing the risks and benefits, the patient was                        deemed in satisfactory condition to undergo the                        procedure.                       After obtaining informed consent, the colonoscope was                        passed under direct vision. Throughout the procedure,  the patient's blood pressure, pulse, and oxygen                        saturations were monitored continuously. The                        Colonoscope was introduced through the anus and           advanced to the the cecum, identified by appendiceal                        orifice and ileocecal valve. The colonoscopy was                        performed without difficulty. The patient tolerated the                        procedure well. The quality of the bowel preparation                        was excellent. Findings:      The perianal and digital rectal examinations were normal.      A 3 mm polyp was found in the transverse colon. The polyp was sessile.       The polyp was removed with a cold biopsy forceps. Resection and       retrieval were complete.      Two sessile polyps were found in the sigmoid colon. The polyps were 2 to       3 mm in size. These polyps were removed with a cold biopsy forceps.       Resection and retrieval were complete. Impression:           - One 3 mm polyp in the transverse colon, removed with                        a cold biopsy forceps. Resected and retrieved.                       - Two 2 to 3 mm polyps in the sigmoid colon, removed                        with a cold biopsy forceps. Resected and retrieved. Recommendation:       - Discharge patient to home.                       - Resume previous diet.                       - Continue present medications.                       - Await pathology results.                       - Repeat colonoscopy in 5 years if polyp adenoma and 10                        years if hyperplastic Procedure Code(s):    --- Professional ---  45380, Colonoscopy, flexible; with biopsy, single or                        multiple Diagnosis Code(s):    --- Professional ---                       Z12.11, Encounter for screening for malignant neoplasm                        of colon                       D12.3, Benign neoplasm of transverse colon (hepatic                        flexure or splenic flexure)                       D12.5, Benign neoplasm of sigmoid colon CPT copyright 2016 American Medical  Association. All rights reserved. The codes documented in this report are preliminary and upon coder review may  be revised to meet current compliance requirements. Lucilla Lame MD, MD 01/31/2017 9:19:40 AM This report has been signed electronically. Number of Addenda: 0 Note Initiated On: 01/31/2017 9:01 AM Scope Withdrawal Time: 0 hours 7 minutes 49 seconds  Total Procedure Duration: 0 hours 9 minutes 48 seconds       [facility]

## 2017-02-01 LAB — SURGICAL PATHOLOGY

## 2017-02-02 ENCOUNTER — Encounter: Payer: Self-pay | Admitting: Gastroenterology

## 2017-02-20 ENCOUNTER — Telehealth: Payer: Self-pay | Admitting: Cardiology

## 2017-02-20 NOTE — Telephone Encounter (Signed)
3 attempts to schedule fu appt from recall list.   Deleting recall.   

## 2017-02-21 ENCOUNTER — Other Ambulatory Visit: Payer: Self-pay | Admitting: Family Medicine

## 2017-06-14 ENCOUNTER — Other Ambulatory Visit: Payer: Self-pay | Admitting: Family Medicine

## 2017-06-14 DIAGNOSIS — I10 Essential (primary) hypertension: Secondary | ICD-10-CM

## 2017-06-14 NOTE — Telephone Encounter (Signed)
Copied from [place] [id number]. Topic: Quick Communication - See Telephone Encounter >> Jun 14, 2017  4:10 PM Ether Griffins B wrote: CRM for notification. See Telephone encounter for:  Former [place] pt transferring to [place]. He is needing refill on lisinopril, hydrohlorothiazide, rosuvastatin, pantaproazole. Pt is set to see Dr. Aundra Dubin and establish with her on 07/07/17. meds sent to [facility] on [address].  06/14/17.

## 2017-06-15 NOTE — Telephone Encounter (Signed)
Pt checking status, please advise

## 2017-06-16 MED ORDER — LISINOPRIL 40 MG PO TABS: 40.0000 mg | ORAL_TABLET | Freq: Every day | ORAL | 0 refills | Status: DC

## 2017-06-16 MED ORDER — ROSUVASTATIN CALCIUM 20 MG PO TABS: 20.0000 mg | ORAL_TABLET | Freq: Every day | ORAL | 0 refills | Status: DC

## 2017-06-16 MED ORDER — PANTOPRAZOLE SODIUM 40 MG PO TBEC: 40.0000 mg | DELAYED_RELEASE_TABLET | Freq: Every day | ORAL | 0 refills | Status: DC

## 2017-06-16 MED ORDER — HYDROCHLOROTHIAZIDE 12.5 MG PO CAPS: 12.5000 mg | ORAL_CAPSULE | Freq: Every day | ORAL | 0 refills | Status: DC

## 2017-07-07 ENCOUNTER — Encounter: Payer: Self-pay | Admitting: Internal Medicine

## 2017-07-07 ENCOUNTER — Ambulatory Visit: Payer: BLUE CROSS/BLUE SHIELD | Admitting: Internal Medicine

## 2017-07-07 VITALS — BP 120/68 | HR 67 | Temp 98.9°F | Ht 74.0 in | Wt 201.8 lb

## 2017-07-07 DIAGNOSIS — I10 Essential (primary) hypertension: Secondary | ICD-10-CM | POA: Diagnosis not present

## 2017-07-07 DIAGNOSIS — Z720 Tobacco use: Secondary | ICD-10-CM | POA: Diagnosis not present

## 2017-07-07 DIAGNOSIS — Z13818 Encounter for screening for other digestive system disorders: Secondary | ICD-10-CM

## 2017-07-07 DIAGNOSIS — M545 Low back pain, unspecified: Secondary | ICD-10-CM

## 2017-07-07 DIAGNOSIS — Z1159 Encounter for screening for other viral diseases: Secondary | ICD-10-CM | POA: Diagnosis not present

## 2017-07-07 DIAGNOSIS — J069 Acute upper respiratory infection, unspecified: Secondary | ICD-10-CM | POA: Diagnosis not present

## 2017-07-07 DIAGNOSIS — Z1329 Encounter for screening for other suspected endocrine disorder: Secondary | ICD-10-CM | POA: Diagnosis not present

## 2017-07-07 DIAGNOSIS — K219 Gastro-esophageal reflux disease without esophagitis: Secondary | ICD-10-CM | POA: Diagnosis not present

## 2017-07-07 DIAGNOSIS — E785 Hyperlipidemia, unspecified: Secondary | ICD-10-CM | POA: Diagnosis not present

## 2017-07-07 DIAGNOSIS — R7303 Prediabetes: Secondary | ICD-10-CM | POA: Diagnosis not present

## 2017-07-07 MED ORDER — HYDROCHLOROTHIAZIDE 12.5 MG PO CAPS: 12.5000 mg | ORAL_CAPSULE | Freq: Every day | ORAL | 0 refills | Status: DC

## 2017-07-07 MED ORDER — ROSUVASTATIN CALCIUM 20 MG PO TABS: 20.0000 mg | ORAL_TABLET | Freq: Every day | ORAL | 1 refills | Status: DC

## 2017-07-07 MED ORDER — LISINOPRIL 40 MG PO TABS: 40.0000 mg | ORAL_TABLET | Freq: Every day | ORAL | 0 refills | Status: DC

## 2017-07-07 MED ORDER — PANTOPRAZOLE SODIUM 40 MG PO TBEC: 40.0000 mg | DELAYED_RELEASE_TABLET | Freq: Every day | ORAL | 0 refills | Status: DC

## 2017-07-07 MED ORDER — KETOCONAZOLE 2 % EX CREA
1.0000 | TOPICAL_CREAM | Freq: Two times a day (BID) | CUTANEOUS | 11 refills | Status: DC | PRN
Start: 2017-07-07 — End: 2018-02-23

## 2017-07-07 MED ORDER — AZITHROMYCIN 250 MG PO TABS: ORAL_TABLET | ORAL | 0 refills | Status: DC

## 2017-07-07 NOTE — Patient Instructions (Addendum)
Please schedule fasting labs w/in the next 2 weeks f/u in 2 months  If back pain not better we will Xray it at your follow up   Try heat, tylenol 500 mg up to 6x per day or occasional Ibuprofen for low back pain  Try Centrum Silver Mens Multivitamin daily  Take care  Back Pain, Adult Back pain is very common. The pain often gets better over time. The cause of back pain is usually not dangerous. Most people can learn to manage their back pain on their own. Follow these instructions at home: Watch your back pain for any changes. The following actions may help to lessen any pain you are feeling:  Stay active. Start with short walks on flat ground if you can. Try to walk farther each day.  Exercise regularly as told by your doctor. Exercise helps your back heal faster. It also helps avoid future injury by keeping your muscles strong and flexible.  Do not sit, drive, or stand in one place for more than 30 minutes.  Do not stay in bed. Resting more than 1-2 days can slow down your recovery.  Be careful when you bend or lift an object. Use good form when lifting: ? Bend at your knees. ? Keep the object close to your body. ? Do not twist.  Sleep on a firm mattress. Lie on your side, and bend your knees. If you lie on your back, put a pillow under your knees.  Take medicines only as told by your doctor.  Put ice on the injured area. ? Put ice in a plastic bag. ? Place a towel between your skin and the bag. ? Leave the ice on for 20 minutes, 2-3 times a day for the first 2-3 days. After that, you can switch between ice and heat packs.  Avoid feeling anxious or stressed. Find good ways to deal with stress, such as exercise.  Maintain a healthy weight. Extra weight puts stress on your back.  Contact a doctor if:  You have pain that does not go away with rest or medicine.  You have worsening pain that goes down into your legs or buttocks.  You have pain that does not get better in one  week.  You have pain at night.  You lose weight.  You have a fever or chills. Get help right away if:  You cannot control when you poop (bowel movement) or pee (urinate).  Your arms or legs feel weak.  Your arms or legs lose feeling (numbness).  You feel sick to your stomach (nauseous) or throw up (vomit).  You have belly (abdominal) pain.  You feel like you may pass out (faint). This information is not intended to replace advice given to you by your health care provider. Make sure you discuss any questions you have with your health care provider. Document Released: 11/09/2007 Document Revised: 10/29/2015 Document Reviewed: 09/24/2013 Elsevier Interactive Patient Education  [employer].

## 2017-07-07 NOTE — Progress Notes (Signed)
Pre visit review using our clinic review tool, if applicable. No additional management support is needed unless otherwise documented below in the visit note. 

## 2017-07-10 ENCOUNTER — Encounter: Payer: Self-pay | Admitting: Internal Medicine

## 2017-07-10 DIAGNOSIS — Z720 Tobacco use: Secondary | ICD-10-CM | POA: Insufficient documentation

## 2017-07-10 NOTE — Progress Notes (Signed)
Chief Complaint  Patient presents with  . Follow-up    transfer from Dr Lacinda Axon   Follow up.  1. HTN controlled he needs refill on Lisinopril 40 mg qd and othermedications  2. URI last Friday sneezing, fever, cough though cough improved. No sweating, n/v or sore throat.No medications tried  3. C/o low back pain worse w/in the last 1 week since #2 w/o radiation of pain. His wife tried OTC NSAID w/o relief.  He does report heavy lifting at work which make pain worse.    Review of Systems  Constitutional: Negative for weight loss.  HENT: Negative for sore throat.   Eyes:       No vision problems   Respiratory: Positive for cough. Negative for shortness of breath.   Cardiovascular: Negative for chest pain.  Gastrointestinal: Negative for abdominal pain.  Musculoskeletal: Positive for back pain.  Skin: Negative for rash.  Neurological: Negative for headaches.  Psychiatric/Behavioral: Negative for memory loss.   Past Medical History:  Diagnosis Date  . GERD (gastroesophageal reflux disease)   . Hyperlipidemia   . Hypertension   . Low back pain    Past Surgical History:  Procedure Laterality Date  . COLONOSCOPY WITH PROPOFOL N/A 01/31/2017   Procedure: COLONOSCOPY WITH PROPOFOL;  Surgeon: Lucilla Lame, MD;  Location: [facility] ENDOSCOPY;  Service: Endoscopy;  Laterality: N/A;   Family History  Problem Relation Age of Onset  . Hypertension Mother   . Heart attack Mother   . Hypertension Father    Social History   Socioeconomic History  . Marital status: Married    Spouse name: Not on file  . Number of children: Not on file  . Years of education: Not on file  . Highest education level: Not on file  Social Needs  . Financial resource strain: Not on file  . Food insecurity - worry: Not on file  . Food insecurity - inability: Not on file  . Transportation needs - medical: Not on file  . Transportation needs - non-medical: Not on file  Occupational History  . Not on file  Tobacco  Use  . Smoking status: Current Every Day Smoker    Packs/day: 0.50    Years: 30.00    Pack years: 15.00  . Smokeless tobacco: Never Used  Substance and Sexual Activity  . Alcohol use: Yes    Comment: socially  . Drug use: No  . Sexual activity: Not on file  Other Topics Concern  . Not on file  Social History Narrative   From [place]    Married    Current Meds  Medication Sig  . hydrochlorothiazide (MICROZIDE) 12.5 MG capsule Take 1 capsule (12.5 mg total) by mouth daily.  Marland Kitchen ketoconazole (NIZORAL) 2 % cream Apply 1 application topically 2 (two) times daily as needed for irritation.  Marland Kitchen lisinopril (PRINIVIL,ZESTRIL) 40 MG tablet Take 1 tablet (40 mg total) by mouth daily.  . pantoprazole (PROTONIX) 40 MG tablet Take 1 tablet (40 mg total) by mouth daily.  . rosuvastatin (CRESTOR) 20 MG tablet Take 1 tablet (20 mg total) by mouth at bedtime.  . [DISCONTINUED] hydrochlorothiazide (MICROZIDE) 12.5 MG capsule Take 1 capsule (12.5 mg total) by mouth daily.  . [DISCONTINUED] ketoconazole (NIZORAL) 2 % cream Apply 1 application topically daily.  . [DISCONTINUED] lisinopril (PRINIVIL,ZESTRIL) 40 MG tablet Take 1 tablet (40 mg total) by mouth daily.  . [DISCONTINUED] pantoprazole (PROTONIX) 40 MG tablet Take 1 tablet (40 mg total) by mouth daily.  . [DISCONTINUED] rosuvastatin (  CRESTOR) 20 MG tablet Take 1 tablet (20 mg total) by mouth daily.   No Known Allergies No results found for this or any previous visit (from the past 2160 hour(s)). Objective  Body mass index is 25.91 kg/m. Wt Readings from Last 3 Encounters:  07/07/17 201 lb 12.8 oz (91.5 kg)  01/31/17 190 lb (86.2 kg)  12/15/16 195 lb 12.8 oz (88.8 kg)   Temp Readings from Last 3 Encounters:  07/07/17 98.9 F (37.2 C) (Oral)  01/31/17 (!) 97.1 F (36.2 C)  12/15/16 98.8 F (37.1 C) (Oral)   BP Readings from Last 3 Encounters:  07/07/17 120/68  01/31/17 113/82  12/15/16 122/80   Pulse Readings from Last 3 Encounters:   07/07/17 67  01/31/17 (!) 58  12/15/16 80   O2 sat room air 99%  Physical Exam  Constitutional: He is oriented to person, place, and time and well-developed, well-nourished, and in no distress. Vital signs are normal.  HENT:  Head: Normocephalic and atraumatic.  Mouth/Throat: Oropharynx is clear and moist.  Eyes: Conjunctivae are normal. Pupils are equal, round, and reactive to light.  Cardiovascular: Normal rate, regular rhythm and normal heart sounds.  Pulmonary/Chest: Effort normal and breath sounds normal.  Abdominal: Soft. Bowel sounds are normal. There is no tenderness.  Musculoskeletal:       Lumbar back: He exhibits no tenderness.  Neurological: He is alert and oriented to person, place, and time. Gait normal. Gait normal.  Skin: Skin is warm, dry and intact.  Psychiatric: Mood, memory, affect and judgment normal.  Nursing note and vitals reviewed.   Assessment   1. HTN controlled/HLD 2. Low back pain  3. URI  4. Tobacco abuse  5. HM Plan  1. Cont lis 40 mg qd refilled today  sch fasting labs w/in the next 2 weeks CMET, CBC, A1C h/o prediabetes, TSH, T4, lipid, UA, hep B status  2. Xray at f/u if not improving  Prn Tylenol or OTC NSAID 3. Supportive care, zpack Mucinex/robitussin DM  4. rec cessation pt c/w cost of meds for cessation  Smoker since age [age] 4-5 cig/qd max 6-7 cig/qd no FH lung cancer  He will try natural remedies like chewing on ginger which was his idea to curb smoking  5.  Offer pna 23 and flu vaccine at f/u  Tdap had   PSA had due 12/15/17 side note pt no longer wishes to take viagra will d/c  Colonoscopy had 01/31/17 +polpys f/u in 5 years  Consider CT chest screening in future for lung cancer if at risk calc risk score in future.  rec centrum Silver mens MVT   Provider: Dr. Olivia Mackie McLean-Scocuzza-Internal Medicine

## 2017-07-21 ENCOUNTER — Other Ambulatory Visit (INDEPENDENT_AMBULATORY_CARE_PROVIDER_SITE_OTHER): Payer: BLUE CROSS/BLUE SHIELD

## 2017-07-21 DIAGNOSIS — I10 Essential (primary) hypertension: Secondary | ICD-10-CM | POA: Diagnosis not present

## 2017-07-21 DIAGNOSIS — R7303 Prediabetes: Secondary | ICD-10-CM

## 2017-07-21 DIAGNOSIS — Z1329 Encounter for screening for other suspected endocrine disorder: Secondary | ICD-10-CM | POA: Diagnosis not present

## 2017-07-21 DIAGNOSIS — E785 Hyperlipidemia, unspecified: Secondary | ICD-10-CM

## 2017-07-21 DIAGNOSIS — Z1159 Encounter for screening for other viral diseases: Secondary | ICD-10-CM

## 2017-07-21 DIAGNOSIS — Z13818 Encounter for screening for other digestive system disorders: Secondary | ICD-10-CM

## 2017-07-21 LAB — COMPREHENSIVE METABOLIC PANEL
ALT: 22 U/L (ref 0–53)
AST: 20 U/L (ref 0–37)
Albumin: 4.4 g/dL (ref 3.5–5.2)
Alkaline Phosphatase: 67 U/L (ref 39–117)
BILIRUBIN TOTAL: 0.7 mg/dL (ref 0.2–1.2)
BUN: 17 mg/dL (ref 6–23)
CALCIUM: 9.3 mg/dL (ref 8.4–10.5)
CO2: 30 meq/L (ref 19–32)
Chloride: 102 mEq/L (ref 96–112)
Creatinine, Ser: 1.24 mg/dL (ref 0.40–1.50)
GFR: 76.89 mL/min (ref >60.00)
Glucose, Bld: 108 mg/dL — ABNORMAL HIGH (ref 70–99)
Potassium: 4 mEq/L (ref 3.5–5.1)
Sodium: 140 mEq/L (ref 135–145)
Total Protein: 7.4 g/dL (ref 6.0–8.3)

## 2017-07-21 LAB — CBC WITH DIFFERENTIAL/PLATELET
BASOS ABS: 0 10*3/uL (ref 0.0–0.1)
BASOS PCT: 0.3 % (ref 0.0–3.0)
Eosinophils Absolute: 0.3 10*3/uL (ref 0.0–0.7)
Eosinophils Relative: 3.1 % (ref 0.0–5.0)
HEMATOCRIT: 44 % (ref 39.0–52.0)
Hemoglobin: 14.7 g/dL (ref 13.0–17.0)
LYMPHS PCT: 28.4 % (ref 12.0–46.0)
Lymphs Abs: 2.3 10*3/uL (ref 0.7–4.0)
MCHC: 33.3 g/dL (ref 30.0–36.0)
MCV: 84.1 fl (ref 78.0–100.0)
MONOS PCT: 8.6 % (ref 3.0–12.0)
Monocytes Absolute: 0.7 10*3/uL (ref 0.1–1.0)
NEUTROS ABS: 4.9 10*3/uL (ref 1.4–7.7)
Neutrophils Relative %: 59.6 % (ref 43.0–77.0)
PLATELETS: 197 10*3/uL (ref 150.0–400.0)
RBC: 5.23 Mil/uL (ref 4.22–5.81)
RDW: 14.2 % (ref 11.5–15.5)
WBC: 8.2 10*3/uL (ref 4.0–10.5)

## 2017-07-21 LAB — URINALYSIS, ROUTINE W REFLEX MICROSCOPIC
BILIRUBIN URINE: NEGATIVE
Leukocytes, UA: NEGATIVE
NITRITE: NEGATIVE
Specific Gravity, Urine: 1.025 (ref 1.000–1.030)
Total Protein, Urine: NEGATIVE
Urine Glucose: NEGATIVE
Urobilinogen, UA: 0.2 (ref 0.0–1.0)
pH: 6 (ref 5.0–8.0)

## 2017-07-21 LAB — LIPID PANEL
CHOLESTEROL: 104 mg/dL (ref 0–200)
HDL: 43.9 mg/dL (ref >39.00)
LDL Cholesterol: 42 mg/dL (ref 0–99)
NonHDL: 60.55
TRIGLYCERIDES: 95 mg/dL (ref 0.0–149.0)
Total CHOL/HDL Ratio: 2
VLDL: 19 mg/dL (ref 0.0–40.0)

## 2017-07-21 LAB — TSH: TSH: 0.88 u[IU]/mL (ref 0.35–4.50)

## 2017-07-21 LAB — T4, FREE: Free T4: 0.82 ng/dL (ref 0.60–1.60)

## 2017-07-21 LAB — HEMOGLOBIN A1C: HEMOGLOBIN A1C: 6.5 % (ref 4.6–6.5)

## 2017-07-22 LAB — HEPATITIS B SURFACE ANTIGEN: HEP B S AG: NONREACTIVE

## 2017-07-22 LAB — HEPATITIS B SURFACE ANTIBODY, QUANTITATIVE

## 2017-07-27 ENCOUNTER — Other Ambulatory Visit: Payer: Self-pay | Admitting: Internal Medicine

## 2017-07-27 ENCOUNTER — Telehealth: Payer: Self-pay | Admitting: Radiology

## 2017-07-27 DIAGNOSIS — R319 Hematuria, unspecified: Secondary | ICD-10-CM

## 2017-07-27 NOTE — Telephone Encounter (Signed)
Pt coming in for urine culture Friday per notes. Please place future order. Thank you.

## 2017-07-28 ENCOUNTER — Other Ambulatory Visit: Payer: BLUE CROSS/BLUE SHIELD

## 2017-07-28 DIAGNOSIS — R319 Hematuria, unspecified: Secondary | ICD-10-CM

## 2017-07-29 LAB — URINE CULTURE
MICRO NUMBER:: 90236638
Result:: NO GROWTH
SPECIMEN QUALITY:: ADEQUATE

## 2017-07-31 ENCOUNTER — Telehealth: Payer: Self-pay

## 2017-07-31 DIAGNOSIS — R319 Hematuria, unspecified: Secondary | ICD-10-CM

## 2017-07-31 NOTE — Telephone Encounter (Signed)
Order has been reordered per request.

## 2017-08-10 ENCOUNTER — Ambulatory Visit
Admission: RE | Admit: 2017-08-10 | Discharge: 2017-08-10 | Disposition: A | Discharge status: Home / Self Care | Payer: Managed Care, Other (non HMO) | Source: Ambulatory Visit | Attending: Internal Medicine | Admitting: Internal Medicine

## 2017-08-10 DIAGNOSIS — R319 Hematuria, unspecified: Secondary | ICD-10-CM | POA: Diagnosis present

## 2017-08-10 MED ORDER — IOPAMIDOL (ISOVUE-300) INJECTION 61%
125.0000 mL | Freq: Once | INTRAVENOUS | Status: AC | PRN
  Administered 2017-08-10: 125 mL via INTRAVENOUS

## 2017-08-21 ENCOUNTER — Encounter: Payer: Self-pay | Admitting: *Deleted

## 2017-08-28 NOTE — Telephone Encounter (Signed)
Unread mychart message mailed to patient 

## 2017-09-04 ENCOUNTER — Ambulatory Visit: Payer: BLUE CROSS/BLUE SHIELD | Admitting: Internal Medicine

## 2017-09-04 DIAGNOSIS — Z0289 Encounter for other administrative examinations: Secondary | ICD-10-CM

## 2017-10-17 ENCOUNTER — Telehealth: Payer: Self-pay | Admitting: Internal Medicine

## 2017-10-17 NOTE — Telephone Encounter (Signed)
Copied from [place] #[id number]. Topic: Quick Communication - Rx Refill/Question >> Oct 17, 2017  3:20 PM Ahmed Prima L wrote: Medication: hydrochlorothiazide (MICROZIDE) 12.5 MG capsule, lisinopril (PRINIVIL,ZESTRIL) 40 MG tablet, pantoprazole (PROTONIX) 40 MG tablet, rosuvastatin (CRESTOR) 20 MG tablet, ketoconazole (NIZORAL) 2 % cream Has the patient contacted their pharmacy? yes (Agent: If no, request that the patient contact the pharmacy for the refill.) Preferred Pharmacy (with phone number or street name): [place], [place] Agent: Please be advised that RX refills may take up to 3 business days. We ask that you follow-up with your pharmacy.

## 2017-10-18 ENCOUNTER — Other Ambulatory Visit: Payer: Self-pay | Admitting: *Deleted

## 2017-10-18 DIAGNOSIS — K219 Gastro-esophageal reflux disease without esophagitis: Secondary | ICD-10-CM

## 2017-10-18 DIAGNOSIS — I10 Essential (primary) hypertension: Secondary | ICD-10-CM

## 2017-10-18 DIAGNOSIS — E785 Hyperlipidemia, unspecified: Secondary | ICD-10-CM

## 2017-10-18 MED ORDER — HYDROCHLOROTHIAZIDE 12.5 MG PO CAPS: 12.5000 mg | ORAL_CAPSULE | Freq: Every day | ORAL | 0 refills | Status: DC

## 2017-10-18 MED ORDER — ROSUVASTATIN CALCIUM 20 MG PO TABS: 20.0000 mg | ORAL_TABLET | Freq: Every day | ORAL | 1 refills | Status: DC

## 2017-10-18 MED ORDER — PANTOPRAZOLE SODIUM 40 MG PO TBEC: 40.0000 mg | DELAYED_RELEASE_TABLET | Freq: Every day | ORAL | 0 refills | Status: DC

## 2017-10-18 MED ORDER — LISINOPRIL 40 MG PO TABS: 40.0000 mg | ORAL_TABLET | Freq: Every day | ORAL | 0 refills | Status: DC

## 2018-02-20 ENCOUNTER — Other Ambulatory Visit: Payer: Self-pay | Admitting: Internal Medicine

## 2018-02-20 ENCOUNTER — Telehealth: Payer: Self-pay

## 2018-02-20 DIAGNOSIS — K219 Gastro-esophageal reflux disease without esophagitis: Secondary | ICD-10-CM

## 2018-02-20 MED ORDER — PANTOPRAZOLE SODIUM 40 MG PO TBEC: 40.0000 mg | DELAYED_RELEASE_TABLET | Freq: Every day | ORAL | 1 refills | Status: DC

## 2018-02-20 NOTE — Telephone Encounter (Signed)
Please change to  [id number] - [place], [place] - [address] [phone] (Phone) [phone] (Fax)

## 2018-02-20 NOTE — Telephone Encounter (Signed)
Copied from [place] [id number]. Topic: General - Other >> Feb 20, 2018 10:27 AM Carolyn Stare wrote:  Pt call for his bp medicine and heart burn medicine and id not know the name of the medicine or pharmacy

## 2018-02-20 NOTE — Telephone Encounter (Signed)
Pt called stating that he takes 4 medications (verified HCTZ, lisinopril, atorvastatin, and pantoprazole). Pt states that [facility] told him he doesn't have any refills. Pt states he has been out of BP medications for over a month. He said [facility] advised there are no refills for any of the 4 medications.  Pt wants in 90 day supply but states they only fill for 30 and then charge him more. Please advise.

## 2018-02-23 ENCOUNTER — Other Ambulatory Visit: Payer: Self-pay | Admitting: Internal Medicine

## 2018-02-23 DIAGNOSIS — K219 Gastro-esophageal reflux disease without esophagitis: Secondary | ICD-10-CM

## 2018-02-23 DIAGNOSIS — I10 Essential (primary) hypertension: Secondary | ICD-10-CM

## 2018-02-23 DIAGNOSIS — E785 Hyperlipidemia, unspecified: Secondary | ICD-10-CM

## 2018-02-23 DIAGNOSIS — B359 Dermatophytosis, unspecified: Secondary | ICD-10-CM

## 2018-02-23 MED ORDER — PANTOPRAZOLE SODIUM 40 MG PO TBEC: 40.0000 mg | DELAYED_RELEASE_TABLET | Freq: Every day | ORAL | 1 refills | Status: DC

## 2018-02-23 MED ORDER — LISINOPRIL 40 MG PO TABS: 40.0000 mg | ORAL_TABLET | Freq: Every day | ORAL | 1 refills | Status: DC

## 2018-02-23 MED ORDER — ROSUVASTATIN CALCIUM 20 MG PO TABS: 20.0000 mg | ORAL_TABLET | Freq: Every day | ORAL | 1 refills | Status: DC

## 2018-02-23 MED ORDER — KETOCONAZOLE 2 % EX CREA: 1.0000 "application " | TOPICAL_CREAM | Freq: Two times a day (BID) | CUTANEOUS | 11 refills | Status: AC | PRN

## 2018-02-23 MED ORDER — HYDROCHLOROTHIAZIDE 12.5 MG PO CAPS: 12.5000 mg | ORAL_CAPSULE | Freq: Every day | ORAL | 1 refills | Status: DC

## 2018-02-23 NOTE — Telephone Encounter (Signed)
Ok to refill others

## 2018-02-23 NOTE — Telephone Encounter (Signed)
rx request patient last seen in 07/2017 and does not have scheduled appt. Pantoprazole was sent in.

## 2018-02-23 NOTE — Telephone Encounter (Signed)
Patient is checking the status. He states that he called in this refill on 9/17. Patient would like a call back. [phone]

## 2018-07-10 ENCOUNTER — Other Ambulatory Visit: Payer: Self-pay | Admitting: Internal Medicine

## 2018-07-10 ENCOUNTER — Encounter: Payer: Self-pay | Admitting: Internal Medicine

## 2018-07-10 ENCOUNTER — Ambulatory Visit (INDEPENDENT_AMBULATORY_CARE_PROVIDER_SITE_OTHER): Payer: Managed Care, Other (non HMO) | Admitting: Internal Medicine

## 2018-07-10 VITALS — BP 134/82 | HR 73 | Temp 98.6°F | Ht 74.0 in | Wt 199.4 lb

## 2018-07-10 DIAGNOSIS — R319 Hematuria, unspecified: Secondary | ICD-10-CM

## 2018-07-10 DIAGNOSIS — Z0000 Encounter for general adult medical examination without abnormal findings: Secondary | ICD-10-CM | POA: Diagnosis not present

## 2018-07-10 DIAGNOSIS — I10 Essential (primary) hypertension: Secondary | ICD-10-CM

## 2018-07-10 DIAGNOSIS — K219 Gastro-esophageal reflux disease without esophagitis: Secondary | ICD-10-CM | POA: Diagnosis not present

## 2018-07-10 DIAGNOSIS — E119 Type 2 diabetes mellitus without complications: Secondary | ICD-10-CM | POA: Diagnosis not present

## 2018-07-10 DIAGNOSIS — E785 Hyperlipidemia, unspecified: Secondary | ICD-10-CM | POA: Diagnosis not present

## 2018-07-10 DIAGNOSIS — E559 Vitamin D deficiency, unspecified: Secondary | ICD-10-CM | POA: Diagnosis not present

## 2018-07-10 DIAGNOSIS — Z125 Encounter for screening for malignant neoplasm of prostate: Secondary | ICD-10-CM

## 2018-07-10 LAB — COMPREHENSIVE METABOLIC PANEL
ALT: 16 U/L (ref 0–53)
AST: 18 U/L (ref 0–37)
Albumin: 4.5 g/dL (ref 3.5–5.2)
Alkaline Phosphatase: 81 U/L (ref 39–117)
BUN: 15 mg/dL (ref 6–23)
CO2: 29 mEq/L (ref 19–32)
Calcium: 9.9 mg/dL (ref 8.4–10.5)
Chloride: 102 mEq/L (ref 96–112)
Creatinine, Ser: 1.15 mg/dL (ref 0.40–1.50)
GFR: 78.65 mL/min (ref >60.00)
Glucose, Bld: 103 mg/dL — ABNORMAL HIGH (ref 70–99)
Potassium: 3.7 mEq/L (ref 3.5–5.1)
Sodium: 138 mEq/L (ref 135–145)
Total Bilirubin: 0.5 mg/dL (ref 0.2–1.2)
Total Protein: 7.4 g/dL (ref 6.0–8.3)

## 2018-07-10 LAB — CBC WITH DIFFERENTIAL/PLATELET
Basophils Absolute: 0 10*3/uL (ref 0.0–0.1)
Basophils Relative: 0.3 % (ref 0.0–3.0)
Eosinophils Absolute: 0.2 10*3/uL (ref 0.0–0.7)
Eosinophils Relative: 2.5 % (ref 0.0–5.0)
HCT: 43.6 % (ref 39.0–52.0)
HEMOGLOBIN: 14.3 g/dL (ref 13.0–17.0)
Lymphocytes Relative: 25.2 % (ref 12.0–46.0)
Lymphs Abs: 1.8 10*3/uL (ref 0.7–4.0)
MCHC: 32.7 g/dL (ref 30.0–36.0)
MCV: 83.4 fl (ref 78.0–100.0)
MONO ABS: 0.6 10*3/uL (ref 0.1–1.0)
Monocytes Relative: 7.6 % (ref 3.0–12.0)
Neutro Abs: 4.7 10*3/uL (ref 1.4–7.7)
Neutrophils Relative %: 64.4 % (ref 43.0–77.0)
Platelets: 237 10*3/uL (ref 150.0–400.0)
RBC: 5.23 Mil/uL (ref 4.22–5.81)
RDW: 15 % (ref 11.5–15.5)
WBC: 7.3 10*3/uL (ref 4.0–10.5)

## 2018-07-10 LAB — LIPID PANEL
Cholesterol: 125 mg/dL (ref 0–200)
HDL: 43.1 mg/dL (ref >39.00)
LDL Cholesterol: 56 mg/dL (ref 0–99)
NonHDL: 81.82
Total CHOL/HDL Ratio: 3
Triglycerides: 128 mg/dL (ref 0.0–149.0)
VLDL: 25.6 mg/dL (ref 0.0–40.0)

## 2018-07-10 LAB — PSA: PSA: 0.55 ng/mL (ref 0.10–4.00)

## 2018-07-10 LAB — HEMOGLOBIN A1C: Hgb A1c MFr Bld: 6.6 % — ABNORMAL HIGH (ref 4.6–6.5)

## 2018-07-10 LAB — VITAMIN D 25 HYDROXY (VIT D DEFICIENCY, FRACTURES): VITD: 19.91 ng/mL — ABNORMAL LOW (ref 30.00–100.00)

## 2018-07-10 MED ORDER — HYDROCHLOROTHIAZIDE 12.5 MG PO TABS: 12.5000 mg | ORAL_TABLET | Freq: Every day | ORAL | 3 refills | Status: DC

## 2018-07-10 MED ORDER — ROSUVASTATIN CALCIUM 20 MG PO TABS: 20.0000 mg | ORAL_TABLET | Freq: Every day | ORAL | 3 refills | Status: DC

## 2018-07-10 MED ORDER — CHOLECALCIFEROL 1.25 MG (50000 UT) PO CAPS: 50000.0000 [IU] | ORAL_CAPSULE | ORAL | 1 refills | Status: DC

## 2018-07-10 MED ORDER — PANTOPRAZOLE SODIUM 40 MG PO TBEC: 40.0000 mg | DELAYED_RELEASE_TABLET | Freq: Every day | ORAL | 3 refills | Status: DC

## 2018-07-10 MED ORDER — LISINOPRIL 40 MG PO TABS: 40.0000 mg | ORAL_TABLET | Freq: Every day | ORAL | 3 refills | Status: DC

## 2018-07-10 NOTE — Patient Instructions (Signed)
Hypertension Hypertension, commonly called high blood pressure, is when the force of blood pumping through the arteries is too strong. The arteries are the blood vessels that carry blood from the heart throughout the body. Hypertension forces the heart to work harder to pump blood and may cause arteries to become narrow or stiff. Having untreated or uncontrolled hypertension can cause heart attacks, strokes, kidney disease, and other problems. A blood pressure reading consists of a higher number over a lower number. Ideally, your blood pressure should be below 120/80. The first ("top") number is called the systolic pressure. It is a measure of the pressure in your arteries as your heart beats. The second ("bottom") number is called the diastolic pressure. It is a measure of the pressure in your arteries as the heart relaxes. What are the causes? The cause of this condition is not known. What increases the risk? Some risk factors for high blood pressure are under your control. Others are not. Factors you can change  Smoking.  Having type 2 diabetes mellitus, high cholesterol, or both.  Not getting enough exercise or physical activity.  Being overweight.  Having too much fat, sugar, calories, or salt (sodium) in your diet.  Drinking too much alcohol. Factors that are difficult or impossible to change  Having chronic kidney disease.  Having a family history of high blood pressure.  Age. Risk increases with age.  Race. You may be at higher risk if you are African-American.  Gender. Men are at higher risk than women before age 45. After age 65, women are at higher risk than men.  Having obstructive sleep apnea.  Stress. What are the signs or symptoms? Extremely high blood pressure (hypertensive crisis) may cause:  Headache.  Anxiety.  Shortness of breath.  Nosebleed.  Nausea and vomiting.  Severe chest pain.  Jerky movements you cannot control (seizures). How is this  diagnosed? This condition is diagnosed by measuring your blood pressure while you are seated, with your arm resting on a surface. The cuff of the blood pressure monitor will be placed directly against the skin of your upper arm at the level of your heart. It should be measured at least twice using the same arm. Certain conditions can cause a difference in blood pressure between your right and left arms. Certain factors can cause blood pressure readings to be lower or higher than normal (elevated) for a short period of time:  When your blood pressure is higher when you are in a health care provider's office than when you are at home, this is called white coat hypertension. Most people with this condition do not need medicines.  When your blood pressure is higher at home than when you are in a health care provider's office, this is called masked hypertension. Most people with this condition may need medicines to control blood pressure. If you have a high blood pressure reading during one visit or you have normal blood pressure with other risk factors:  You may be asked to return on a different day to have your blood pressure checked again.  You may be asked to monitor your blood pressure at home for 1 week or longer. If you are diagnosed with hypertension, you may have other blood or imaging tests to help your health care provider understand your overall risk for other conditions. How is this treated? This condition is treated by making healthy lifestyle changes, such as eating healthy foods, exercising more, and reducing your alcohol intake. Your health care provider   may prescribe medicine if lifestyle changes are not enough to get your blood pressure under control, and if:  Your systolic blood pressure is above 130.  Your diastolic blood pressure is above 80. Your personal target blood pressure may vary depending on your medical conditions, your age, and other factors. Follow these instructions  at home: Eating and drinking   Eat a diet that is high in fiber and potassium, and low in sodium, added sugar, and fat. An example eating plan is called the DASH (Dietary Approaches to Stop Hypertension) diet. To eat this way: ? Eat plenty of fresh fruits and vegetables. Try to fill half of your plate at each meal with fruits and vegetables. ? Eat whole grains, such as whole wheat pasta, brown rice, or whole grain bread. Fill about one quarter of your plate with whole grains. ? Eat or drink low-fat dairy products, such as skim milk or low-fat yogurt. ? Avoid fatty cuts of meat, processed or cured meats, and poultry with skin. Fill about one quarter of your plate with lean proteins, such as fish, chicken without skin, beans, eggs, and tofu. ? Avoid premade and processed foods. These tend to be higher in sodium, added sugar, and fat.  Reduce your daily sodium intake. Most people with hypertension should eat less than 1,500 mg of sodium a day.  Limit alcohol intake to no more than 1 drink a day for nonpregnant women and 2 drinks a day for men. One drink equals 12 oz of beer, 5 oz of wine, or 1 oz of hard liquor. Lifestyle   Work with your health care provider to maintain a healthy body weight or to lose weight. Ask what an ideal weight is for you.  Get at least 30 minutes of exercise that causes your heart to beat faster (aerobic exercise) most days of the week. Activities may include walking, swimming, or biking.  Include exercise to strengthen your muscles (resistance exercise), such as pilates or lifting weights, as part of your weekly exercise routine. Try to do these types of exercises for 30 minutes at least 3 days a week.  Do not use any products that contain nicotine or tobacco, such as cigarettes and e-cigarettes. If you need help quitting, ask your health care provider.  Monitor your blood pressure at home as told by your health care provider.  Keep all follow-up visits as told by  your health care provider. This is important. Medicines  Take over-the-counter and prescription medicines only as told by your health care provider. Follow directions carefully. Blood pressure medicines must be taken as prescribed.  Do not skip doses of blood pressure medicine. Doing this puts you at risk for problems and can make the medicine less effective.  Ask your health care provider about side effects or reactions to medicines that you should watch for. Contact a health care provider if:  You think you are having a reaction to a medicine you are taking.  You have headaches that keep coming back (recurring).  You feel dizzy.  You have swelling in your ankles.  You have trouble with your vision. Get help right away if:  You develop a severe headache or confusion.  You have unusual weakness or numbness.  You feel faint.  You have severe pain in your chest or abdomen.  You vomit repeatedly.  You have trouble breathing. Summary  Hypertension is when the force of blood pumping through your arteries is too strong. If this condition is not controlled, it   may put you at risk for serious complications.  Your personal target blood pressure may vary depending on your medical conditions, your age, and other factors. For most people, a normal blood pressure is less than 120/80.  Hypertension is treated with lifestyle changes, medicines, or a combination of both. Lifestyle changes include weight loss, eating a healthy, low-sodium diet, exercising more, and limiting alcohol. This information is not intended to replace advice given to you by your health care provider. Make sure you discuss any questions you have with your health care provider. Document Released: 05/23/2005 Document Revised: 04/20/2016 Document Reviewed: 04/20/2016 Elsevier Interactive Patient Education  2019 [employer] DASH Eating Plan DASH stands for "Dietary Approaches to Stop Hypertension." The DASH eating  plan is a healthy eating plan that has been shown to reduce high blood pressure (hypertension). It may also reduce your risk for type 2 diabetes, heart disease, and stroke. The DASH eating plan may also help with weight loss. What are tips for following this plan?  General guidelines  Avoid eating more than 2,300 mg (milligrams) of salt (sodium) a day. If you have hypertension, you may need to reduce your sodium intake to 1,500 mg a day.  Limit alcohol intake to no more than 1 drink a day for nonpregnant women and 2 drinks a day for men. One drink equals 12 oz of beer, 5 oz of wine, or 1 oz of hard liquor.  Work with your health care provider to maintain a healthy body weight or to lose weight. Ask what an ideal weight is for you.  Get at least 30 minutes of exercise that causes your heart to beat faster (aerobic exercise) most days of the week. Activities may include walking, swimming, or biking.  Work with your health care provider or diet and nutrition specialist (dietitian) to adjust your eating plan to your individual calorie needs. Reading food labels   Check food labels for the amount of sodium per serving. Choose foods with less than 5 percent of the Daily Value of sodium. Generally, foods with less than 300 mg of sodium per serving fit into this eating plan.  To find whole grains, look for the word "whole" as the first word in the ingredient list. Shopping  Buy products labeled as "low-sodium" or "no salt added."  Buy fresh foods. Avoid canned foods and premade or frozen meals. Cooking  Avoid adding salt when cooking. Use salt-free seasonings or herbs instead of table salt or sea salt. Check with your health care provider or pharmacist before using salt substitutes.  Do not fry foods. Cook foods using healthy methods such as baking, boiling, grilling, and broiling instead.  Cook with heart-healthy oils, such as olive, canola, soybean, or sunflower oil. Meal planning  Eat a  balanced diet that includes: ? 5 or more servings of fruits and vegetables each day. At each meal, try to fill half of your plate with fruits and vegetables. ? Up to 6-8 servings of whole grains each day. ? Less than 6 oz of lean meat, poultry, or fish each day. A 3-oz serving of meat is about the same size as a deck of cards. One egg equals 1 oz. ? 2 servings of low-fat dairy each day. ? A serving of nuts, seeds, or beans 5 times each week. ? Heart-healthy fats. Healthy fats called Omega-3 fatty acids are found in foods such as flaxseeds and coldwater fish, like sardines, salmon, and mackerel.  Limit how much you eat of the following: ?   Canned or prepackaged foods. ? Food that is high in trans fat, such as fried foods. ? Food that is high in saturated fat, such as fatty meat. ? Sweets, desserts, sugary drinks, and other foods with added sugar. ? Full-fat dairy products.  Do not salt foods before eating.  Try to eat at least 2 vegetarian meals each week.  Eat more home-cooked food and less restaurant, buffet, and fast food.  When eating at a restaurant, ask that your food be prepared with less salt or no salt, if possible. What foods are recommended? The items listed may not be a complete list. Talk with your dietitian about what dietary choices are best for you. Grains Whole-grain or whole-wheat bread. Whole-grain or whole-wheat pasta. Brown rice. Oatmeal. Quinoa. Bulgur. Whole-grain and low-sodium cereals. Pita bread. Low-fat, low-sodium crackers. Whole-wheat flour tortillas. Vegetables Fresh or frozen vegetables (raw, steamed, roasted, or grilled). Low-sodium or reduced-sodium tomato and vegetable juice. Low-sodium or reduced-sodium tomato sauce and tomato paste. Low-sodium or reduced-sodium canned vegetables. Fruits All fresh, dried, or frozen fruit. Canned fruit in natural juice (without added sugar). Meat and other protein foods Skinless chicken or turkey. Ground chicken or  turkey. Pork with fat trimmed off. Fish and seafood. Egg whites. Dried beans, peas, or lentils. Unsalted nuts, nut butters, and seeds. Unsalted canned beans. Lean cuts of beef with fat trimmed off. Low-sodium, lean deli meat. Dairy Low-fat (1%) or fat-free (skim) milk. Fat-free, low-fat, or reduced-fat cheeses. Nonfat, low-sodium ricotta or cottage cheese. Low-fat or nonfat yogurt. Low-fat, low-sodium cheese. Fats and oils Soft margarine without trans fats. Vegetable oil. Low-fat, reduced-fat, or light mayonnaise and salad dressings (reduced-sodium). Canola, safflower, olive, soybean, and sunflower oils. Avocado. Seasoning and other foods Herbs. Spices. Seasoning mixes without salt. Unsalted popcorn and pretzels. Fat-free sweets. What foods are not recommended? The items listed may not be a complete list. Talk with your dietitian about what dietary choices are best for you. Grains Baked goods made with fat, such as croissants, muffins, or some breads. Dry pasta or rice meal packs. Vegetables Creamed or fried vegetables. Vegetables in a cheese sauce. Regular canned vegetables (not low-sodium or reduced-sodium). Regular canned tomato sauce and paste (not low-sodium or reduced-sodium). Regular tomato and vegetable juice (not low-sodium or reduced-sodium). Pickles. Olives. Fruits Canned fruit in a light or heavy syrup. Fried fruit. Fruit in cream or butter sauce. Meat and other protein foods Fatty cuts of meat. Ribs. Fried meat. Bacon. Sausage. Bologna and other processed lunch meats. Salami. Fatback. Hotdogs. Bratwurst. Salted nuts and seeds. Canned beans with added salt. Canned or smoked fish. Whole eggs or egg yolks. Chicken or turkey with skin. Dairy Whole or 2% milk, cream, and half-and-half. Whole or full-fat cream cheese. Whole-fat or sweetened yogurt. Full-fat cheese. Nondairy creamers. Whipped toppings. Processed cheese and cheese spreads. Fats and oils Butter. Stick margarine. Lard.  Shortening. Ghee. Bacon fat. Tropical oils, such as coconut, palm kernel, or palm oil. Seasoning and other foods Salted popcorn and pretzels. Onion salt, garlic salt, seasoned salt, table salt, and sea salt. Worcestershire sauce. Tartar sauce. Barbecue sauce. Teriyaki sauce. Soy sauce, including reduced-sodium. Steak sauce. Canned and packaged gravies. Fish sauce. Oyster sauce. Cocktail sauce. Horseradish that you find on the shelf. Ketchup. Mustard. Meat flavorings and tenderizers. Bouillon cubes. Hot sauce and Tabasco sauce. Premade or packaged marinades. Premade or packaged taco seasonings. Relishes. Regular salad dressings. Where to find more information:  National Heart, Lung, and Blood Institute: www.nhlbi.nih.gov  American Heart Association: www.heart.org Summary    The DASH eating plan is a healthy eating plan that has been shown to reduce high blood pressure (hypertension). It may also reduce your risk for type 2 diabetes, heart disease, and stroke.  With the DASH eating plan, you should limit salt (sodium) intake to 2,300 mg a day. If you have hypertension, you may need to reduce your sodium intake to 1,500 mg a day.  When on the DASH eating plan, aim to eat more fresh fruits and vegetables, whole grains, lean proteins, low-fat dairy, and heart-healthy fats.  Work with your health care provider or diet and nutrition specialist (dietitian) to adjust your eating plan to your individual calorie needs. This information is not intended to replace advice given to you by your health care provider. Make sure you discuss any questions you have with your health care provider. Document Released: 05/12/2011 Document Revised: 05/16/2016 Document Reviewed: 05/16/2016 Elsevier Interactive Patient Education  2019 Elsevier Inc.  

## 2018-07-10 NOTE — Progress Notes (Signed)
Chief Complaint  Patient presents with  . Annual Exam   Annual  1. HTN controlled on hctz 12.5 mg qd, lis 40 mg qd  2. HLD on crestor 20 mg qd  3. FH prostate cancer in dad  4. DM 2 A1C 6.5 last year   Review of Systems  Constitutional: Negative for weight loss.  HENT: Negative for hearing loss.   Eyes: Negative for blurred vision.  Respiratory: Negative for shortness of breath.   Cardiovascular: Negative for chest pain.  Gastrointestinal: Negative for abdominal pain.  Skin: Negative for rash.  Neurological: Negative for headaches.  Psychiatric/Behavioral: Negative for memory loss.   Past Medical History:  Diagnosis Date  . GERD (gastroesophageal reflux disease)   . Hyperlipidemia   . Hypertension   . Low back pain    Past Surgical History:  Procedure Laterality Date  . COLONOSCOPY WITH PROPOFOL N/A 01/31/2017   Procedure: COLONOSCOPY WITH PROPOFOL;  Surgeon: Lucilla Lame, MD;  Location: [facility] ENDOSCOPY;  Service: Endoscopy;  Laterality: N/A;   Family History  Problem Relation Age of Onset  . Hypertension Mother   . Heart attack Mother   . Hypertension Father   . Cancer Father        prostate    Social History   Socioeconomic History  . Marital status: Married    Spouse name: Not on file  . Number of children: Not on file  . Years of education: Not on file  . Highest education level: Not on file  Occupational History  . Not on file  Social Needs  . Financial resource strain: Not on file  . Food insecurity:    Worry: Not on file    Inability: Not on file  . Transportation needs:    Medical: Not on file    Non-medical: Not on file  Tobacco Use  . Smoking status: Current Every Day Smoker    Packs/day: 0.50    Years: 30.00    Pack years: 15.00  . Smokeless tobacco: Never Used  Substance and Sexual Activity  . Alcohol use: Yes    Comment: socially  . Drug use: No  . Sexual activity: Not on file  Lifestyle  . Physical activity:    Days per week: Not on  file    Minutes per session: Not on file  . Stress: Not on file  Relationships  . Social connections:    Talks on phone: Not on file    Gets together: Not on file    Attends religious service: Not on file    Active member of club or organization: Not on file    Attends meetings of clubs or organizations: Not on file    Relationship status: Not on file  . Intimate partner violence:    Fear of current or ex partner: Not on file    Emotionally abused: Not on file    Physically abused: Not on file    Forced sexual activity: Not on file  Other Topics Concern  . Not on file  Social History Narrative   From [place]    Married    9 kids 5 girls and 4 boys    Current Meds  Medication Sig  . ketoconazole (NIZORAL) 2 % cream Apply 1 application topically 2 (two) times daily as needed for irritation.  Marland Kitchen lisinopril (PRINIVIL,ZESTRIL) 40 MG tablet Take 1 tablet (40 mg total) by mouth daily.  . pantoprazole (PROTONIX) 40 MG tablet Take 1 tablet (40 mg total) by mouth  daily.  . rosuvastatin (CRESTOR) 20 MG tablet Take 1 tablet (20 mg total) by mouth at bedtime. appt further refills  . [DISCONTINUED] hydrochlorothiazide (MICROZIDE) 12.5 MG capsule Take 1 capsule (12.5 mg total) by mouth daily.  . [DISCONTINUED] lisinopril (PRINIVIL,ZESTRIL) 40 MG tablet Take 1 tablet (40 mg total) by mouth daily.  . [DISCONTINUED] pantoprazole (PROTONIX) 40 MG tablet Take 1 tablet (40 mg total) by mouth daily.  . [DISCONTINUED] rosuvastatin (CRESTOR) 20 MG tablet Take 1 tablet (20 mg total) by mouth at bedtime. appt further refills   No Known Allergies No results found for this or any previous visit (from the past 2160 hour(s)). Objective  Body mass index is 25.6 kg/m. Wt Readings from Last 3 Encounters:  07/10/18 199 lb 6.4 oz (90.4 kg)  07/07/17 201 lb 12.8 oz (91.5 kg)  01/31/17 190 lb (86.2 kg)   Temp Readings from Last 3 Encounters:  07/10/18 98.6 F (37 C) (Oral)  07/07/17 98.9 F (37.2 C) (Oral)   01/31/17 (!) 97.1 F (36.2 C)   BP Readings from Last 3 Encounters:  07/10/18 134/82  07/07/17 120/68  01/31/17 113/82   Pulse Readings from Last 3 Encounters:  07/10/18 73  07/07/17 67  01/31/17 (!) 58    Physical Exam Vitals signs and nursing note reviewed.  Constitutional:      Appearance: Normal appearance. He is well-developed, well-groomed and overweight.  HENT:     Head: Normocephalic and atraumatic.     Nose: Nose normal.     Mouth/Throat:     Mouth: Mucous membranes are moist.     Pharynx: Oropharynx is clear.  Eyes:     Conjunctiva/sclera: Conjunctivae normal.     Pupils: Pupils are equal, round, and reactive to light.  Cardiovascular:     Rate and Rhythm: Normal rate and regular rhythm.     Heart sounds: Normal heart sounds. No murmur.  Pulmonary:     Effort: Pulmonary effort is normal.     Breath sounds: Normal breath sounds.  Musculoskeletal:     Right lower leg: No edema.     Left lower leg: No edema.  Skin:    General: Skin is warm and dry.  Neurological:     General: No focal deficit present.     Mental Status: He is alert and oriented to person, place, and time. Mental status is at baseline.     Gait: Gait normal.  Psychiatric:        Attention and Perception: Attention and perception normal.        Mood and Affect: Mood and affect normal.        Speech: Speech normal.        Behavior: Behavior normal. Behavior is cooperative.        Thought Content: Thought content normal.        Cognition and Memory: Cognition and memory normal.        Judgment: Judgment normal.     Assessment   1.HTN/HLD  2. FH prostate cancer  3. DM 2 A1C 6.5 07/21/08  4. Annual today  Plan   1. Refilled meds  Fasting labs today  2. Labs today DRE at f/u  3. Check A1C today  [facility] exam tomorrow 07/11/2018 fax note  4.  Declines flu vaccine  Offer pna 23 in future smoker   Tdap had   PSA had due 12/15/17 side note pt no longer wishes to take viagra  will d/c  Colonoscopy had 01/31/17 +  polpys f/u in 5 years  Consider CT chest screening in future for lung cancer if at risk calc risk score in future low risk smoker 5-6 cig to < 1/2 (~ 8-10) ppd no FH lung cancer father had prostate cancer  -rec smoking cessation  rec centrum Silver mens MVT  Fasting labs today rec healthy diet and exercise   Provider: Dr. Olivia Mackie McLean-Scocuzza-[facility]

## 2018-07-11 LAB — URINALYSIS, ROUTINE W REFLEX MICROSCOPIC
Bilirubin Urine: NEGATIVE
Glucose, UA: NEGATIVE
Hgb urine dipstick: NEGATIVE
Ketones, ur: NEGATIVE
LEUKOCYTES UA: NEGATIVE
Nitrite: NEGATIVE
Protein, ur: NEGATIVE
Specific Gravity, Urine: 1.01 (ref 1.001–1.03)
pH: 5.5 (ref 5.0–8.0)

## 2018-07-11 LAB — HM DIABETES EYE EXAM

## 2018-07-12 ENCOUNTER — Encounter: Payer: Self-pay | Admitting: Internal Medicine

## 2019-01-09 ENCOUNTER — Encounter: Payer: Managed Care, Other (non HMO) | Admitting: Internal Medicine

## 2019-03-11 ENCOUNTER — Other Ambulatory Visit: Payer: Self-pay

## 2019-03-11 ENCOUNTER — Telehealth: Payer: Self-pay | Admitting: Internal Medicine

## 2019-03-11 NOTE — Telephone Encounter (Signed)
Pt has a cough he states its coming from his blood pressure medication. Please advise? I advised pt he cannot come into the ofc. Pt has appt on 03/13/2019.Thank you!

## 2019-03-12 NOTE — Telephone Encounter (Signed)
Any fever, covid exposure, sob How long has cough been?  appt 03/13/19 need answers to ?s any + covid ? Will need to be virtual appt    [place]

## 2019-03-12 NOTE — Telephone Encounter (Signed)
Please advise. Thank you

## 2019-03-13 ENCOUNTER — Encounter: Payer: Self-pay | Admitting: Internal Medicine

## 2019-03-13 ENCOUNTER — Other Ambulatory Visit: Payer: Self-pay

## 2019-03-13 ENCOUNTER — Ambulatory Visit (INDEPENDENT_AMBULATORY_CARE_PROVIDER_SITE_OTHER): Payer: Managed Care, Other (non HMO) | Admitting: Internal Medicine

## 2019-03-13 VITALS — BP 126/76 | HR 81 | Temp 98.1°F | Ht 74.0 in | Wt 195.6 lb

## 2019-03-13 DIAGNOSIS — E559 Vitamin D deficiency, unspecified: Secondary | ICD-10-CM

## 2019-03-13 DIAGNOSIS — Z1329 Encounter for screening for other suspected endocrine disorder: Secondary | ICD-10-CM | POA: Diagnosis not present

## 2019-03-13 DIAGNOSIS — E119 Type 2 diabetes mellitus without complications: Secondary | ICD-10-CM

## 2019-03-13 DIAGNOSIS — K219 Gastro-esophageal reflux disease without esophagitis: Secondary | ICD-10-CM

## 2019-03-13 DIAGNOSIS — Z0000 Encounter for general adult medical examination without abnormal findings: Secondary | ICD-10-CM | POA: Diagnosis not present

## 2019-03-13 DIAGNOSIS — I10 Essential (primary) hypertension: Secondary | ICD-10-CM

## 2019-03-13 DIAGNOSIS — R319 Hematuria, unspecified: Secondary | ICD-10-CM

## 2019-03-13 MED ORDER — OMEPRAZOLE 40 MG PO CPDR: 40.0000 mg | DELAYED_RELEASE_CAPSULE | Freq: Every day | ORAL | 3 refills | Status: DC

## 2019-03-13 MED ORDER — AMLODIPINE BESYLATE 5 MG PO TABS: 5.0000 mg | ORAL_TABLET | Freq: Every day | ORAL | 3 refills | Status: DC

## 2019-03-13 NOTE — Patient Instructions (Addendum)
Stop lisinopril 40 mg daily  Start norvsac 5 mg daily for blood pressure    Amlodipine tablets What is this medicine? AMLODIPINE (am LOE di peen) is a calcium-channel blocker. It affects the amount of calcium found in your heart and muscle cells. This relaxes your blood vessels, which can reduce the amount of work the heart has to do. This medicine is used to lower high blood pressure. It is also used to prevent chest pain. This medicine may be used for other purposes; ask your health care provider or pharmacist if you have questions. COMMON BRAND NAME(S): Norvasc What should I tell my health care provider before I take this medicine? They need to know if you have any of these conditions:  heart disease  liver disease  an unusual or allergic reaction to amlodipine, other medicines, foods, dyes, or preservatives  pregnant or trying to get pregnant  breast-feeding How should I use this medicine? Take this medicine by mouth with a glass of water. Follow the directions on the prescription label. You can take it with or without food. If it upsets your stomach, take it with food. Take your medicine at regular intervals. Do not take it more often than directed. Do not stop taking except on your doctor's advice. Talk to your pediatrician regarding the use of this medicine in children. While this drug may be prescribed for children as young as 6 years for selected conditions, precautions do apply. Patients over 56 years of age may have a stronger reaction and need a smaller dose. Overdosage: If you think you have taken too much of this medicine contact a poison control center or emergency room at once. NOTE: This medicine is only for you. Do not share this medicine with others. What if I miss a dose? If you miss a dose, take it as soon as you can. If it is almost time for your next dose, take only that dose. Do not take double or extra doses. What may interact with this medicine? Do not take  this medicine with any of the following medications:  tranylcypromine This medicine may also interact with the following medications:  clarithromycin  cyclosporine  diltiazem  itraconazole  simvastatin  tacrolimus This list may not describe all possible interactions. Give your health care provider a list of all the medicines, herbs, non-prescription drugs, or dietary supplements you use. Also tell them if you smoke, drink alcohol, or use illegal drugs. Some items may interact with your medicine. What should I watch for while using this medicine? Visit your healthcare professional for regular checks on your progress. Check your blood pressure as directed. Ask your healthcare professional what your blood pressure should be and when you should contact him or her. Do not treat yourself for coughs, colds, or pain while you are using this medicine without asking your healthcare professional for advice. Some medicines may increase your blood pressure. You may get dizzy. Do not drive, use machinery, or do anything that needs mental alertness until you know how this medicine affects you. Do not stand or sit up quickly, especially if you are an older patient. This reduces the risk of dizzy or fainting spells. Avoid alcoholic drinks; they can make you dizzier. What side effects may I notice from receiving this medicine? Side effects that you should report to your doctor or health care professional as soon as possible:  allergic reactions like skin rash, itching or hives; swelling of the face, lips, or tongue  fast, irregular heartbeat  signs and symptoms of low blood pressure like dizziness; feeling faint or lightheaded, falls; unusually weak or tired  swelling of ankles, feet, hands Side effects that usually do not require medical attention (report these to your doctor or health care professional if they continue or are bothersome):  dry mouth  facial flushing  headache  stomach  pain  tiredness This list may not describe all possible side effects. Call your doctor for medical advice about side effects. You may report side effects to FDA at 1-800-FDA-1088. Where should I keep my medicine? Keep out of the reach of children. Store at room temperature between 59 and 86 degrees F (15 and 30 degrees C). Throw away any unused medicine after the expiration date. NOTE: This sheet is a summary. It may not cover all possible information. If you have questions about this medicine, talk to your doctor, pharmacist, or health care provider.  2020 Elsevier/Gold Standard (2017-12-15 15:07:10)  Pneumococcal Conjugate Vaccine suspension for injection What is this medicine? PNEUMOCOCCAL VACCINE (NEU mo KOK al vak SEEN) is a vaccine used to prevent pneumococcus bacterial infections. These bacteria can cause serious infections like pneumonia, meningitis, and blood infections. This vaccine will lower your chance of getting pneumonia. If you do get pneumonia, it can make your symptoms milder and your illness shorter. This vaccine will not treat an infection and will not cause infection. This vaccine is recommended for infants and young children, adults with certain medical conditions, and adults 58 years or older. This medicine may be used for other purposes; ask your health care provider or pharmacist if you have questions. COMMON BRAND NAME(S): Prevnar, Prevnar 13 What should I tell my health care provider before I take this medicine? They need to know if you have any of these conditions:  bleeding problems  fever  immune system problems  an unusual or allergic reaction to pneumococcal vaccine, diphtheria toxoid, other vaccines, latex, other medicines, foods, dyes, or preservatives  pregnant or trying to get pregnant  breast-feeding How should I use this medicine? This vaccine is for injection into a muscle. It is given by a health care professional. A copy of Vaccine Information  Statements will be given before each vaccination. Read this sheet carefully each time. The sheet may change frequently. Talk to your pediatrician regarding the use of this medicine in children. While this drug may be prescribed for children as young as 50 weeks old for selected conditions, precautions do apply. Overdosage: If you think you have taken too much of this medicine contact a poison control center or emergency room at once. NOTE: This medicine is only for you. Do not share this medicine with others. What if I miss a dose? It is important not to miss your dose. Call your doctor or health care professional if you are unable to keep an appointment. What may interact with this medicine?  medicines for cancer chemotherapy  medicines that suppress your immune function  steroid medicines like prednisone or cortisone This list may not describe all possible interactions. Give your health care provider a list of all the medicines, herbs, non-prescription drugs, or dietary supplements you use. Also tell them if you smoke, drink alcohol, or use illegal drugs. Some items may interact with your medicine. What should I watch for while using this medicine? Mild fever and pain should go away in 3 days or less. Report any unusual symptoms to your doctor or health care professional. What side effects may I notice from receiving this medicine?  Side effects that you should report to your doctor or health care professional as soon as possible:  allergic reactions like skin rash, itching or hives, swelling of the face, lips, or tongue  breathing problems  confused  fast or irregular heartbeat  fever over 102 degrees F  seizures  unusual bleeding or bruising  unusual muscle weakness Side effects that usually do not require medical attention (report to your doctor or health care professional if they continue or are bothersome):  aches and pains  diarrhea  fever of 102 degrees F or  less  headache  irritable  loss of appetite  pain, tender at site where injected  trouble sleeping This list may not describe all possible side effects. Call your doctor for medical advice about side effects. You may report side effects to FDA at 1-800-FDA-1088. Where should I keep my medicine? This does not apply. This vaccine is given in a clinic, pharmacy, doctor's office, or other health care setting and will not be stored at home. NOTE: This sheet is a summary. It may not cover all possible information. If you have questions about this medicine, talk to your doctor, pharmacist, or health care provider.  2020 Elsevier/Gold Standard (2014-02-27 10:27:27)

## 2019-03-13 NOTE — Progress Notes (Signed)
Chief Complaint  Patient presents with  . Annual Exam   Annual doing well except  1. HTN on lis 40 mg qd causing dry cough so wants to stop on hctz 12.5 mg qd and up to this pt still taking lis 40 mg and BP 126/76  2. Smoking 1 pk lasts 2.5 days since age [age]    Review of Systems  Constitutional: Positive for weight loss.       Down 4 lbs last visit    HENT: Negative for hearing loss.   Eyes: Negative for blurred vision.  Respiratory: Positive for cough.   Cardiovascular: Negative for chest pain.  Gastrointestinal: Negative for abdominal pain.  Musculoskeletal: Negative for falls.  Skin: Negative for rash.  Neurological: Negative for headaches.  Psychiatric/Behavioral: Negative for depression.   Past Medical History:  Diagnosis Date  . GERD (gastroesophageal reflux disease)   . Hyperlipidemia   . Hypertension   . Low back pain    Past Surgical History:  Procedure Laterality Date  . COLONOSCOPY WITH PROPOFOL N/A 01/31/2017   Procedure: COLONOSCOPY WITH PROPOFOL;  Surgeon: Lucilla Lame, MD;  Location: [facility] ENDOSCOPY;  Service: Endoscopy;  Laterality: N/A;   Family History  Problem Relation Age of Onset  . Hypertension Mother   . Heart attack Mother   . Hypertension Father   . Cancer Father        prostate    Social History   Socioeconomic History  . Marital status: Married    Spouse name: Not on file  . Number of children: Not on file  . Years of education: Not on file  . Highest education level: Not on file  Occupational History  . Not on file  Social Needs  . Financial resource strain: Not on file  . Food insecurity    Worry: Not on file    Inability: Not on file  . Transportation needs    Medical: Not on file    Non-medical: Not on file  Tobacco Use  . Smoking status: Current Every Day Smoker    Packs/day: 0.50    Years: 30.00    Pack years: 15.00  . Smokeless tobacco: Never Used  Substance and Sexual Activity  . Alcohol use: Yes    Comment:  socially  . Drug use: No  . Sexual activity: Not on file  Lifestyle  . Physical activity    Days per week: Not on file    Minutes per session: Not on file  . Stress: Not on file  Relationships  . Social Herbalist on phone: Not on file    Gets together: Not on file    Attends religious service: Not on file    Active member of club or organization: Not on file    Attends meetings of clubs or organizations: Not on file    Relationship status: Not on file  . Intimate partner violence    Fear of current or ex partner: Not on file    Emotionally abused: Not on file    Physically abused: Not on file    Forced sexual activity: Not on file  Other Topics Concern  . Not on file  Social History Narrative   From [place]    Married 27 years as of 03/2019    9 kids 3 girls and 4 boys    Employed    Current Meds  Medication Sig  . Cholecalciferol 1.25 MG (50000 UT) capsule Take 1 capsule (50,000 Units total)  by mouth once a week. D3 generic ok d/c after 6 months  . hydrochlorothiazide (HYDRODIURIL) 12.5 MG tablet Take 1 tablet (12.5 mg total) by mouth daily.  Marland Kitchen ketoconazole (NIZORAL) 2 % cream Apply 1 application topically 2 (two) times daily as needed for irritation.  . rosuvastatin (CRESTOR) 20 MG tablet Take 1 tablet (20 mg total) by mouth at bedtime. appt further refills  . [DISCONTINUED] pantoprazole (PROTONIX) 40 MG tablet Take 1 tablet (40 mg total) by mouth daily.   Allergies  Allergen Reactions  . Lisinopril     Cough    No results found for this or any previous visit (from the past 2160 hour(s)). Objective  Body mass index is 25.11 kg/m. Wt Readings from Last 3 Encounters:  03/13/19 195 lb 9.6 oz (88.7 kg)  07/10/18 199 lb 6.4 oz (90.4 kg)  07/07/17 201 lb 12.8 oz (91.5 kg)   Temp Readings from Last 3 Encounters:  03/13/19 98.1 F (36.7 C) (Oral)  07/10/18 98.6 F (37 C) (Oral)  07/07/17 98.9 F (37.2 C) (Oral)   BP Readings from Last 3 Encounters:   03/13/19 126/76  07/10/18 134/82  07/07/17 120/68   Pulse Readings from Last 3 Encounters:  03/13/19 81  07/10/18 73  07/07/17 67    Physical Exam Vitals signs and nursing note reviewed.  Constitutional:      Appearance: Normal appearance. He is well-developed, well-groomed and overweight.     Comments: +mask on    HENT:     Head: Normocephalic and atraumatic.     Comments: Mask on      Ears:     Comments: B/l cerumen in ears   Eyes:     Conjunctiva/sclera: Conjunctivae normal.     Pupils: Pupils are equal, round, and reactive to light.  Cardiovascular:     Rate and Rhythm: Normal rate and regular rhythm.     Pulses: Normal pulses.     Heart sounds: Normal heart sounds.  Pulmonary:     Effort: Pulmonary effort is normal.     Breath sounds: Normal breath sounds.  Chest:     Breasts: Breasts are symmetrical.   Abdominal:     Tenderness: There is no abdominal tenderness.  Genitourinary:    Prostate: Normal. Not enlarged, not tender and no nodules present.     Rectum: No external hemorrhoid.  Skin:    General: Skin is warm and dry.  Neurological:     General: No focal deficit present.     Mental Status: He is alert and oriented to person, place, and time. Mental status is at baseline.     Gait: Gait normal.  Psychiatric:        Attention and Perception: Attention and perception normal.        Mood and Affect: Mood and affect normal.        Speech: Speech normal.        Behavior: Behavior normal. Behavior is cooperative.        Thought Content: Thought content normal.        Cognition and Memory: Cognition and memory normal.        Judgment: Judgment normal.     Assessment  Plan  Annual physical exam Fasting labs in future  Declines flu vaccine  pna 23 vaccine given today  Tdap had 2017  PSA 0.55 07/10/18 FH prostate cancer father DRE exam 03/13/2019 normal  Colonoscopy had 01/31/17 +polpys f/u in 5 years  Consider CT chest screening in future  for lung  cancer if at risk calc risk score in future low risk smoker 5-10 cig to < 1/2 lasts x 2.5 days no FH lung cancer father had prostate cancer smoker since age 63/18 -rec smoking cessation  rec centrum Silver mens MVT  rec healthy diet and exercise   Gastroesophageal reflux disease without esophagitis - Plan: omeprazole (PRILOSEC) 40 MG capsule protonix 40 mg qd too $  Essential hypertension - Plan: amLODipine (NORVASC) 5 MG tablet with hctz 12.5 mg qd  D/c lis 40 mg qd due to cough   Type 2 diabetes mellitus without complication, without long-term current use of insulin ([place]) - Plan: Microalbumin / creatinine urine ratio A1C  On CCB could not tolerate ACEI  Eye exam 07/11/18 neg retinopathy  Foot exam in future  Cont statin   Hematuria, unspecified type - Plan: Urinalysis, Routine w reflex microscopic CT scan in past 08/2017 negative kidney stones or etiology of blood   Vitamin D deficiency - Plan: Vitamin D (25 hydroxy)   Provider: Dr. Olivia Mackie McLean-Scocuzza-[facility] Medicine

## 2019-03-28 ENCOUNTER — Other Ambulatory Visit: Payer: Self-pay

## 2019-03-28 ENCOUNTER — Other Ambulatory Visit (INDEPENDENT_AMBULATORY_CARE_PROVIDER_SITE_OTHER): Payer: Managed Care, Other (non HMO)

## 2019-03-28 DIAGNOSIS — Z1329 Encounter for screening for other suspected endocrine disorder: Secondary | ICD-10-CM | POA: Diagnosis not present

## 2019-03-28 DIAGNOSIS — E119 Type 2 diabetes mellitus without complications: Secondary | ICD-10-CM | POA: Diagnosis not present

## 2019-03-28 DIAGNOSIS — I10 Essential (primary) hypertension: Secondary | ICD-10-CM

## 2019-03-28 DIAGNOSIS — E559 Vitamin D deficiency, unspecified: Secondary | ICD-10-CM

## 2019-03-28 DIAGNOSIS — R319 Hematuria, unspecified: Secondary | ICD-10-CM

## 2019-03-28 LAB — LIPID PANEL
Cholesterol: 92 mg/dL (ref 0–200)
HDL: 40.3 mg/dL (ref >39.00)
LDL Cholesterol: 34 mg/dL (ref 0–99)
NonHDL: 51.39
Total CHOL/HDL Ratio: 2
Triglycerides: 88 mg/dL (ref 0.0–149.0)
VLDL: 17.6 mg/dL (ref 0.0–40.0)

## 2019-03-28 LAB — CBC WITH DIFFERENTIAL/PLATELET
Basophils Absolute: 0 10*3/uL (ref 0.0–0.1)
Basophils Relative: 0.2 % (ref 0.0–3.0)
Eosinophils Absolute: 0.2 10*3/uL (ref 0.0–0.7)
Eosinophils Relative: 2.1 % (ref 0.0–5.0)
HCT: 41.5 % (ref 39.0–52.0)
Hemoglobin: 13.6 g/dL (ref 13.0–17.0)
Lymphocytes Relative: 25 % (ref 12.0–46.0)
Lymphs Abs: 2.1 10*3/uL (ref 0.7–4.0)
MCHC: 32.7 g/dL (ref 30.0–36.0)
MCV: 84.1 fl (ref 78.0–100.0)
Monocytes Absolute: 0.7 10*3/uL (ref 0.1–1.0)
Monocytes Relative: 9.1 % (ref 3.0–12.0)
Neutro Abs: 5.3 10*3/uL (ref 1.4–7.7)
Neutrophils Relative %: 63.6 % (ref 43.0–77.0)
Platelets: 219 10*3/uL (ref 150.0–400.0)
RBC: 4.94 Mil/uL (ref 4.22–5.81)
RDW: 14.2 % (ref 11.5–15.5)
WBC: 8.3 10*3/uL (ref 4.0–10.5)

## 2019-03-28 LAB — COMPREHENSIVE METABOLIC PANEL
ALT: 17 U/L (ref 0–53)
AST: 20 U/L (ref 0–37)
Albumin: 4.7 g/dL (ref 3.5–5.2)
Alkaline Phosphatase: 66 U/L (ref 39–117)
BUN: 14 mg/dL (ref 6–23)
CO2: 29 mEq/L (ref 19–32)
Calcium: 9.8 mg/dL (ref 8.4–10.5)
Chloride: 101 mEq/L (ref 96–112)
Creatinine, Ser: 1.28 mg/dL (ref 0.40–1.50)
GFR: 69.34 mL/min (ref >60.00)
Glucose, Bld: 100 mg/dL — ABNORMAL HIGH (ref 70–99)
Potassium: 3.8 mEq/L (ref 3.5–5.1)
Sodium: 139 mEq/L (ref 135–145)
Total Bilirubin: 0.7 mg/dL (ref 0.2–1.2)
Total Protein: 7.6 g/dL (ref 6.0–8.3)

## 2019-03-28 LAB — HEMOGLOBIN A1C: Hgb A1c MFr Bld: 6.5 % (ref 4.6–6.5)

## 2019-03-28 LAB — TSH: TSH: 0.7 u[IU]/mL (ref 0.35–4.50)

## 2019-03-28 LAB — VITAMIN D 25 HYDROXY (VIT D DEFICIENCY, FRACTURES): VITD: 34.21 ng/mL (ref 30.00–100.00)

## 2019-03-29 ENCOUNTER — Telehealth: Payer: Self-pay

## 2019-03-29 ENCOUNTER — Other Ambulatory Visit: Payer: Self-pay | Admitting: Internal Medicine

## 2019-03-29 DIAGNOSIS — E119 Type 2 diabetes mellitus without complications: Secondary | ICD-10-CM

## 2019-03-29 LAB — URINALYSIS, ROUTINE W REFLEX MICROSCOPIC
Bilirubin Urine: NEGATIVE
Glucose, UA: NEGATIVE
Hgb urine dipstick: NEGATIVE
Ketones, ur: NEGATIVE
Leukocytes,Ua: NEGATIVE
Nitrite: NEGATIVE
Protein, ur: NEGATIVE
Specific Gravity, Urine: 1.005 (ref 1.001–1.03)
pH: 7 (ref 5.0–8.0)

## 2019-03-29 LAB — MICROALBUMIN / CREATININE URINE RATIO
Creatinine, Urine: 29 mg/dL (ref 20–320)
Microalb, Ur: <0.2 mg/dL

## 2019-03-29 MED ORDER — METFORMIN HCL 500 MG PO TABS: 500.0000 mg | ORAL_TABLET | Freq: Every day | ORAL | 3 refills | Status: DC

## 2019-03-29 NOTE — Telephone Encounter (Signed)
Please see results note.

## 2019-03-29 NOTE — Telephone Encounter (Signed)
Copied from [place] [id number]. Topic: General - Call Back - No Documentation >> Mar 29, 2019 12:10 PM Erick Blinks wrote: [phone] Call back request for lab results

## 2019-07-12 ENCOUNTER — Telehealth: Payer: Self-pay | Admitting: Internal Medicine

## 2019-07-12 NOTE — Telephone Encounter (Signed)
I called pt twice and left vm to call ofc. °

## 2019-07-18 ENCOUNTER — Ambulatory Visit: Payer: Managed Care, Other (non HMO) | Admitting: Internal Medicine

## 2019-07-30 ENCOUNTER — Other Ambulatory Visit: Payer: Self-pay | Admitting: Internal Medicine

## 2019-07-30 DIAGNOSIS — I10 Essential (primary) hypertension: Secondary | ICD-10-CM

## 2019-07-30 DIAGNOSIS — E785 Hyperlipidemia, unspecified: Secondary | ICD-10-CM

## 2019-07-30 MED ORDER — HYDROCHLOROTHIAZIDE 12.5 MG PO TABS: 12.5000 mg | ORAL_TABLET | Freq: Every day | ORAL | 3 refills | Status: DC

## 2019-07-30 MED ORDER — ROSUVASTATIN CALCIUM 20 MG PO TABS: 20.0000 mg | ORAL_TABLET | Freq: Every day | ORAL | 3 refills | Status: DC

## 2019-09-16 LAB — HM DIABETES EYE EXAM

## 2020-02-09 IMAGING — CT CT ABD-PEL WO/W CM
3 of 12 series · 12 of 46 positions shown, 18 images · IV contrast (iopamidol)
Comparison: None.

CLINICAL DATA: Microscopic hematuria.  RIGHT flank pain.

EXAM:
CT ABDOMEN AND PELVIS WITHOUT AND WITH CONTRAST
TECHNIQUE: Multidetector CT imaging of the abdomen and pelvis was performed
following the standard protocol before and following the bolus
administration of intravenous contrast.
CONTRAST:  125mL 4UI2UO-9II IOPAMIDOL ([id number]) INJECTION 61%

[Series 2: without pre · axial · non-contrast · 0.70mm/px · z∈[-1531,-1396]mm · 3 of 96 slices shown]
[im 14/96  soft-tissue]
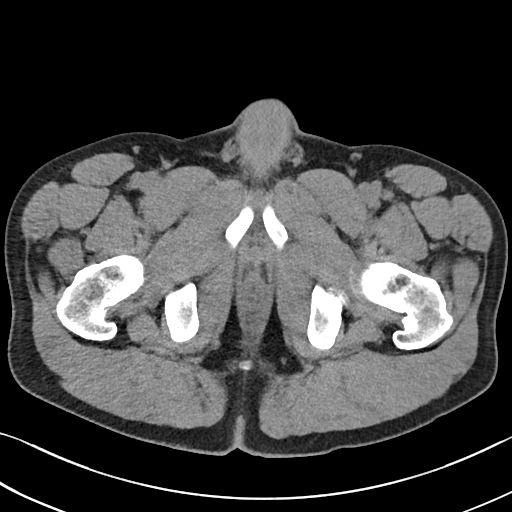
[im 28/96  soft-tissue]
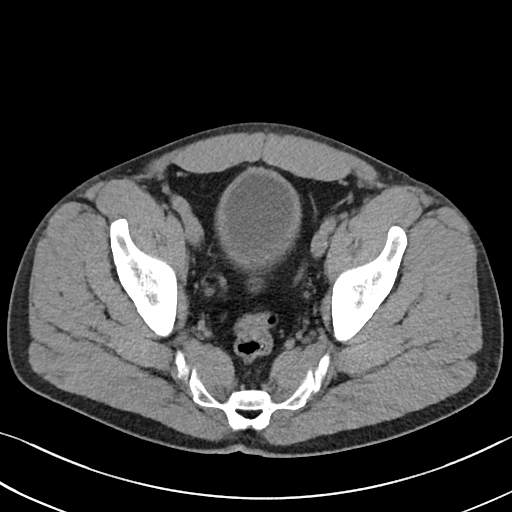
[im 41/96  soft-tissue]
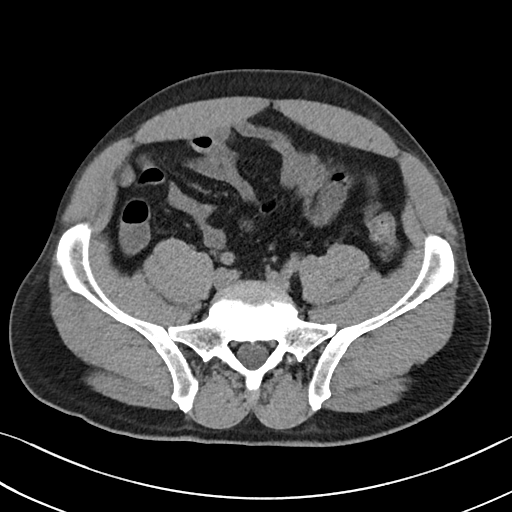

[Series 5: cor without without pre · coronal · non-contrast · 0.69mm/px · 2 of 141 slices shown, 3 images]
[im 47/141  soft-tissue]
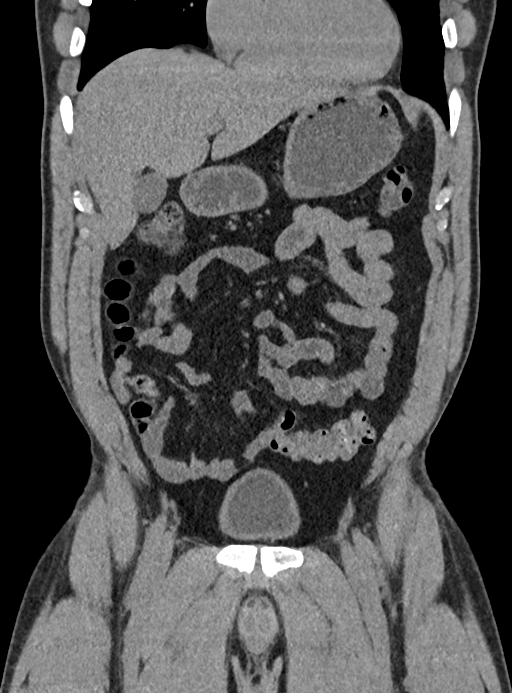
[im 47/141  bone]
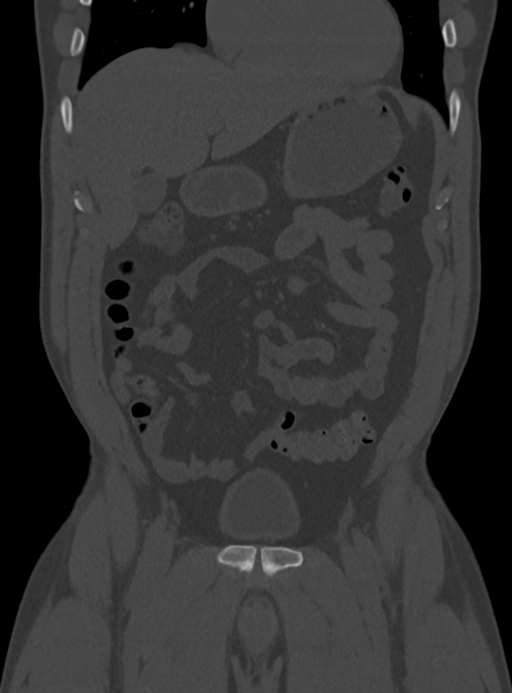
[im 94/141  soft-tissue]
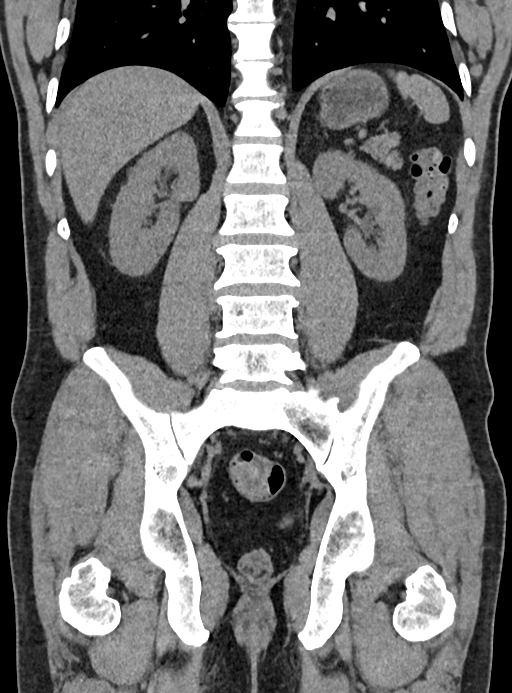

[Series 17: axial delay delay prone · axial · delayed · 0.70mm/px · z∈[-1594,-1204]mm · 7 of 105 slices shown, 12 images]
[im 14/105  soft-tissue]
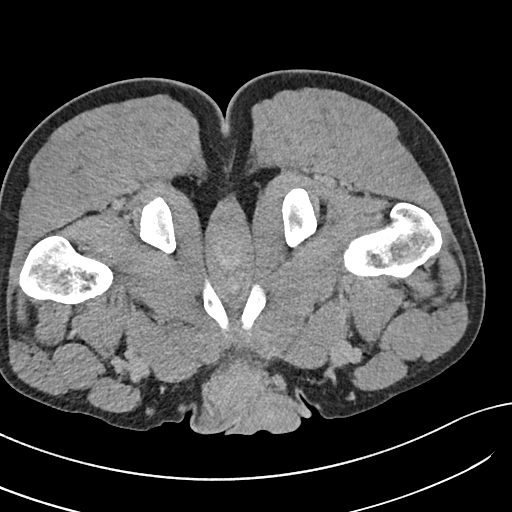
[im 14/105  bone]
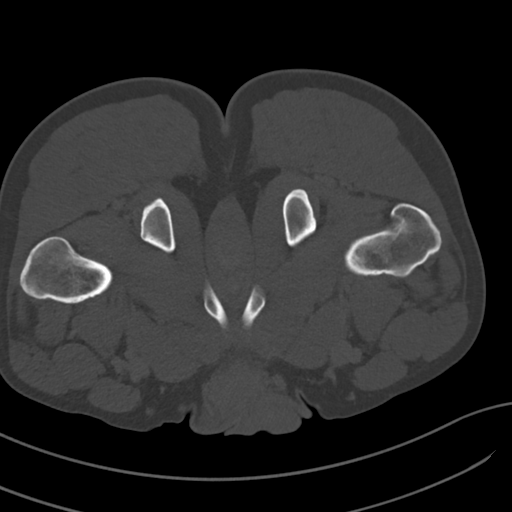
[im 27/105  soft-tissue]
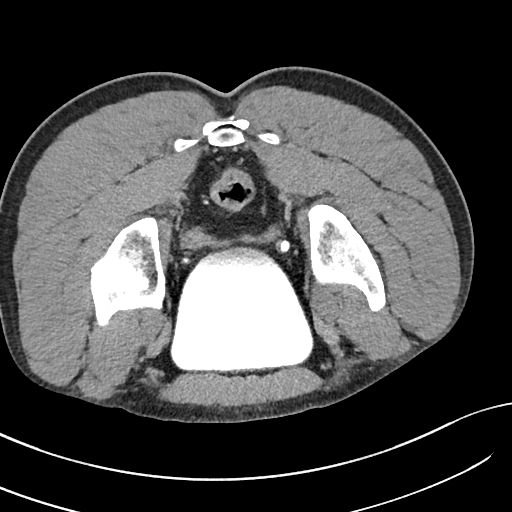
[im 40/105  soft-tissue]
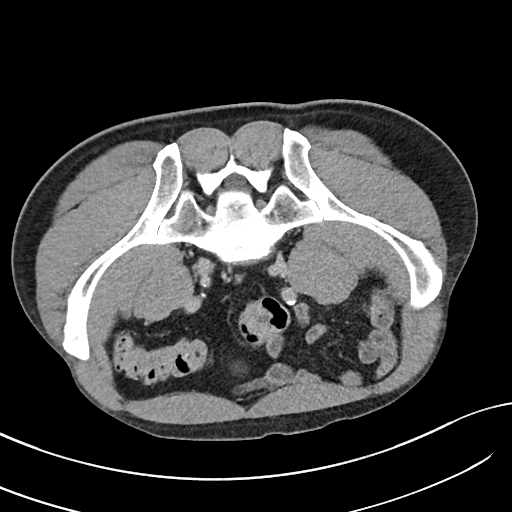
[im 53/105  soft-tissue]
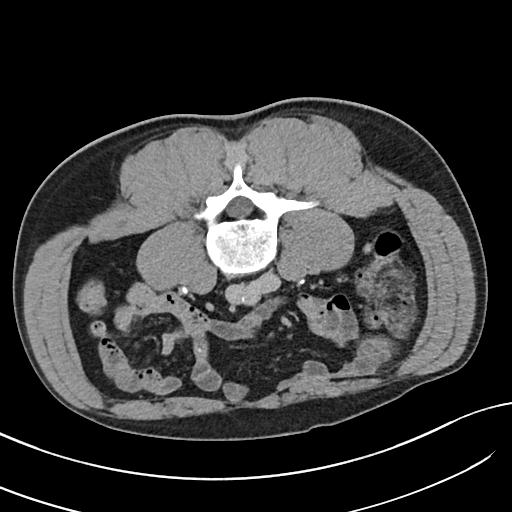
[im 53/105  lung]
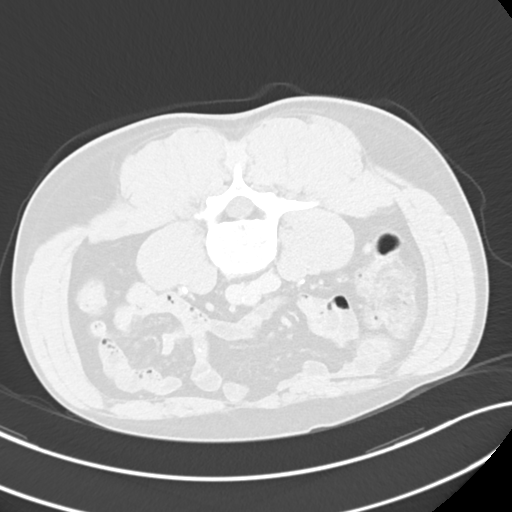
[im 66/105  soft-tissue]
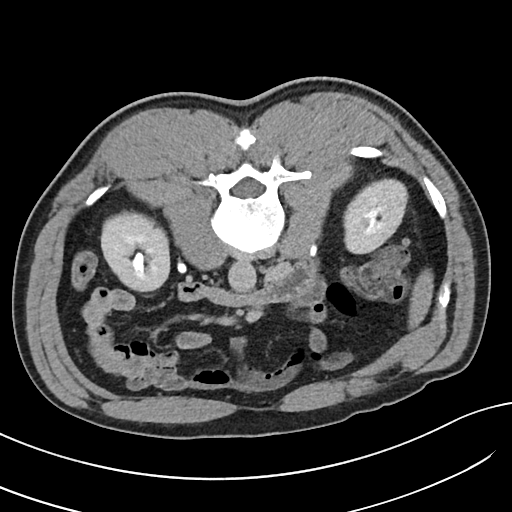
[im 66/105  lung]
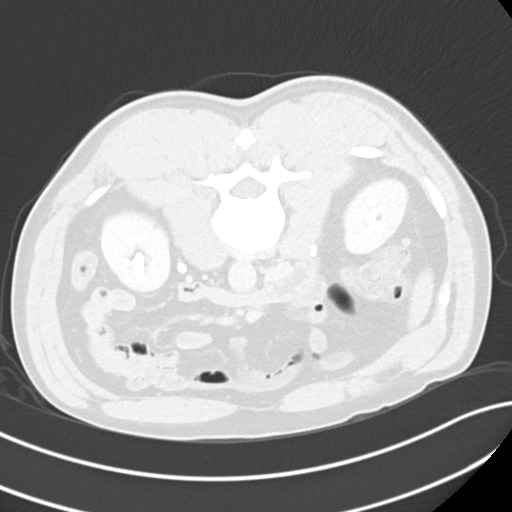
[im 79/105  soft-tissue]
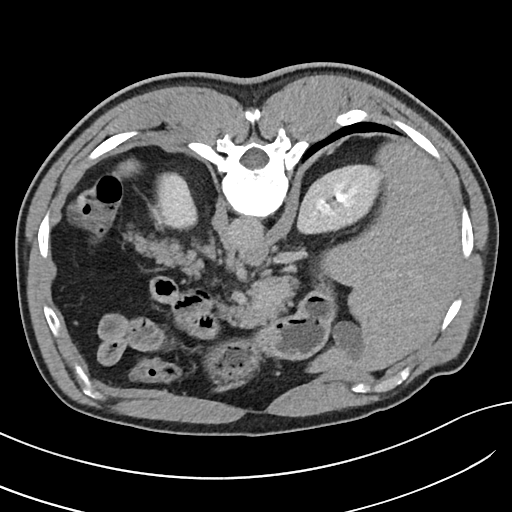
[im 79/105  lung]
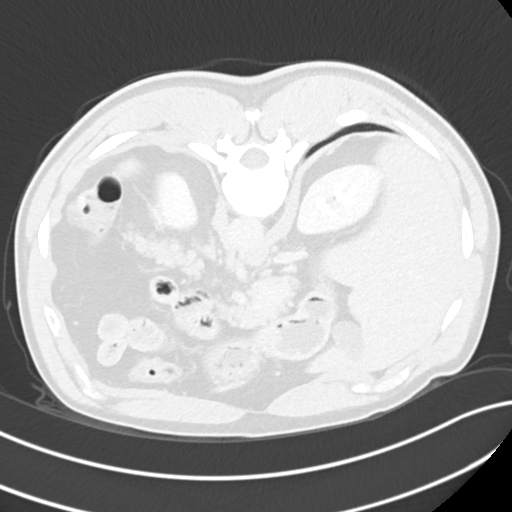
[im 92/105  soft-tissue]
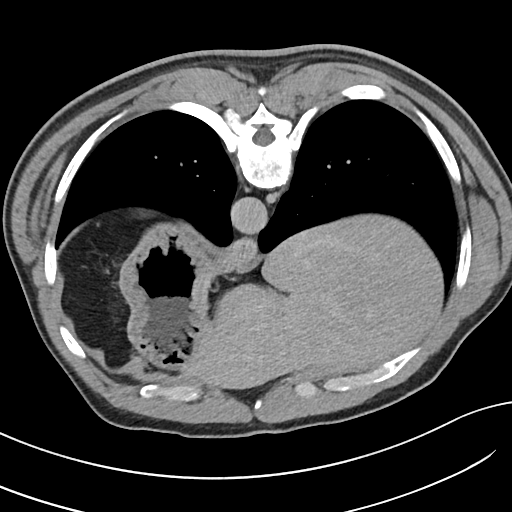
[im 92/105  lung]
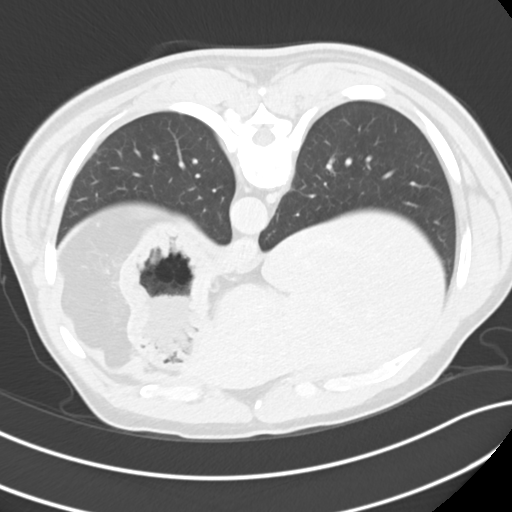

[12 of 46 positions shown; findings below may reference images not displayed]

FINDINGS: Lower chest: Lung bases are clear.

Hepatobiliary: No focal hepatic lesion. No biliary duct dilatation.
Gallbladder is normal. Common bile duct is normal.

Pancreas: Pancreas is normal. No ductal dilatation. No pancreatic
inflammation.

Spleen: Normal spleen

Adrenals/urinary tract: Adrenal glands are normal. No
nephrolithiasis no ureterolithiasis. No obstructive uropathy. No
enhancing renal cortical lesion. No filling defects the collecting
systems or ureters.

Stomach/Bowel: Stomach, small bowel, appendix, and cecum are normal.
The colon and rectosigmoid colon are normal.

Vascular/Lymphatic: Abdominal aorta is normal caliber. No periportal
or retroperitoneal adenopathy. No pelvic adenopathy.

Reproductive: Prostate normal

Other: No free fluid.

Musculoskeletal: No aggressive osseous lesion.
IMPRESSION: 1. No explanation for hematuria. No nephrolithiasis,
ureterolithiasis, enhancing renal cortical lesion, or filling
defects within the collecting systems.
2. No bladder stones or filling defects in the bladder which does
not excluded a bladder lesion.

## 2020-02-17 ENCOUNTER — Telehealth: Payer: Self-pay | Admitting: Internal Medicine

## 2020-02-17 NOTE — Telephone Encounter (Signed)
Pt has questions about all medications. Did not specify. Please call back to advise

## 2020-02-18 NOTE — Telephone Encounter (Signed)
Spoke with Patient and he states he now has [employer] and they would like for him to use a mail order pharmacy. Patient states they should have called [place]. Informed him that I did not receive a call. Patient states he does not know the name of the pharmacy they use.  Informed him that we could not re-send his medications until we knew the name of the pharmacy. He will call and find out where his medications need to go to.   Patient has not been seen since 03/2019. Scheduled Patient for follow up with fasting labs 03/10/20 at 9:00 am.

## 2020-02-18 NOTE — Telephone Encounter (Signed)
Patient was returning call about medications

## 2020-02-18 NOTE — Telephone Encounter (Signed)
Left message to return call 

## 2020-02-28 ENCOUNTER — Telehealth: Payer: Self-pay | Admitting: Internal Medicine

## 2020-02-28 ENCOUNTER — Other Ambulatory Visit: Payer: Self-pay | Admitting: Internal Medicine

## 2020-02-28 DIAGNOSIS — E785 Hyperlipidemia, unspecified: Secondary | ICD-10-CM

## 2020-02-28 DIAGNOSIS — I10 Essential (primary) hypertension: Secondary | ICD-10-CM

## 2020-02-28 DIAGNOSIS — K219 Gastro-esophageal reflux disease without esophagitis: Secondary | ICD-10-CM

## 2020-02-28 DIAGNOSIS — E119 Type 2 diabetes mellitus without complications: Secondary | ICD-10-CM

## 2020-02-28 MED ORDER — AMLODIPINE BESYLATE 5 MG PO TABS: 5.0000 mg | ORAL_TABLET | Freq: Every day | ORAL | 1 refills | Status: DC

## 2020-02-28 MED ORDER — OMEPRAZOLE 40 MG PO CPDR: 40.0000 mg | DELAYED_RELEASE_CAPSULE | Freq: Every day | ORAL | 1 refills | Status: DC

## 2020-02-28 MED ORDER — ROSUVASTATIN CALCIUM 20 MG PO TABS: 20.0000 mg | ORAL_TABLET | Freq: Every day | ORAL | 1 refills | Status: DC

## 2020-02-28 MED ORDER — HYDROCHLOROTHIAZIDE 12.5 MG PO TABS: 12.5000 mg | ORAL_TABLET | Freq: Every day | ORAL | 1 refills | Status: DC

## 2020-02-28 MED ORDER — METFORMIN HCL 500 MG PO TABS: 500.0000 mg | ORAL_TABLET | Freq: Every day | ORAL | 1 refills | Status: DC

## 2020-02-28 NOTE — Telephone Encounter (Signed)
Call pt needs f/u appt fasting am appt to do labs and visit further refills on meds

## 2020-02-28 NOTE — Telephone Encounter (Signed)
Patient already scheduled for 03/11/20 at 10:00 am

## 2020-03-09 ENCOUNTER — Other Ambulatory Visit: Payer: Self-pay

## 2020-03-10 ENCOUNTER — Ambulatory Visit: Payer: Managed Care, Other (non HMO) | Admitting: Internal Medicine

## 2020-03-11 ENCOUNTER — Other Ambulatory Visit: Payer: Self-pay

## 2020-03-11 ENCOUNTER — Encounter: Payer: Self-pay | Admitting: Internal Medicine

## 2020-03-11 ENCOUNTER — Ambulatory Visit: Payer: Managed Care, Other (non HMO) | Admitting: Internal Medicine

## 2020-03-11 VITALS — BP 158/82 | HR 73 | Temp 98.4°F | Ht 74.02 in | Wt 190.6 lb

## 2020-03-11 DIAGNOSIS — Z23 Encounter for immunization: Secondary | ICD-10-CM | POA: Diagnosis not present

## 2020-03-11 DIAGNOSIS — E291 Testicular hypofunction: Secondary | ICD-10-CM | POA: Diagnosis not present

## 2020-03-11 DIAGNOSIS — R5383 Other fatigue: Secondary | ICD-10-CM | POA: Diagnosis not present

## 2020-03-11 DIAGNOSIS — Z0000 Encounter for general adult medical examination without abnormal findings: Secondary | ICD-10-CM

## 2020-03-11 DIAGNOSIS — I152 Hypertension secondary to endocrine disorders: Secondary | ICD-10-CM | POA: Diagnosis not present

## 2020-03-11 DIAGNOSIS — Z1329 Encounter for screening for other suspected endocrine disorder: Secondary | ICD-10-CM

## 2020-03-11 DIAGNOSIS — E119 Type 2 diabetes mellitus without complications: Secondary | ICD-10-CM

## 2020-03-11 DIAGNOSIS — E538 Deficiency of other specified B group vitamins: Secondary | ICD-10-CM

## 2020-03-11 DIAGNOSIS — E785 Hyperlipidemia, unspecified: Secondary | ICD-10-CM

## 2020-03-11 DIAGNOSIS — K219 Gastro-esophageal reflux disease without esophagitis: Secondary | ICD-10-CM

## 2020-03-11 DIAGNOSIS — E559 Vitamin D deficiency, unspecified: Secondary | ICD-10-CM | POA: Diagnosis not present

## 2020-03-11 DIAGNOSIS — I10 Essential (primary) hypertension: Secondary | ICD-10-CM

## 2020-03-11 DIAGNOSIS — Z125 Encounter for screening for malignant neoplasm of prostate: Secondary | ICD-10-CM | POA: Diagnosis not present

## 2020-03-11 DIAGNOSIS — E1159 Type 2 diabetes mellitus with other circulatory complications: Secondary | ICD-10-CM | POA: Diagnosis not present

## 2020-03-11 LAB — LIPID PANEL
Cholesterol: 106 mg/dL (ref 0–200)
HDL: 44.7 mg/dL (ref >39.00)
LDL Cholesterol: 48 mg/dL (ref 0–99)
NonHDL: 61.33
Total CHOL/HDL Ratio: 2
Triglycerides: 69 mg/dL (ref 0.0–149.0)
VLDL: 13.8 mg/dL (ref 0.0–40.0)

## 2020-03-11 LAB — CBC WITH DIFFERENTIAL/PLATELET
Basophils Absolute: 0 10*3/uL (ref 0.0–0.1)
Basophils Relative: 0.3 % (ref 0.0–3.0)
Eosinophils Absolute: 0.1 10*3/uL (ref 0.0–0.7)
Eosinophils Relative: 1.6 % (ref 0.0–5.0)
HCT: 43 % (ref 39.0–52.0)
Hemoglobin: 14.4 g/dL (ref 13.0–17.0)
Lymphocytes Relative: 25.2 % (ref 12.0–46.0)
Lymphs Abs: 2.2 10*3/uL (ref 0.7–4.0)
MCHC: 33.5 g/dL (ref 30.0–36.0)
MCV: 82.8 fl (ref 78.0–100.0)
Monocytes Absolute: 0.6 10*3/uL (ref 0.1–1.0)
Monocytes Relative: 7.1 % (ref 3.0–12.0)
Neutro Abs: 5.7 10*3/uL (ref 1.4–7.7)
Neutrophils Relative %: 65.8 % (ref 43.0–77.0)
Platelets: 211 10*3/uL (ref 150.0–400.0)
RBC: 5.19 Mil/uL (ref 4.22–5.81)
RDW: 14.9 % (ref 11.5–15.5)
WBC: 8.7 10*3/uL (ref 4.0–10.5)

## 2020-03-11 LAB — COMPREHENSIVE METABOLIC PANEL
ALT: 18 U/L (ref 0–53)
AST: 19 U/L (ref 0–37)
Albumin: 4.7 g/dL (ref 3.5–5.2)
Alkaline Phosphatase: 68 U/L (ref 39–117)
BUN: 15 mg/dL (ref 6–23)
CO2: 31 mEq/L (ref 19–32)
Calcium: 9.9 mg/dL (ref 8.4–10.5)
Chloride: 102 mEq/L (ref 96–112)
Creatinine, Ser: 1.13 mg/dL (ref 0.40–1.50)
GFR: 69.73 mL/min (ref >60.00)
Glucose, Bld: 90 mg/dL (ref 70–99)
Potassium: 3.9 mEq/L (ref 3.5–5.1)
Sodium: 139 mEq/L (ref 135–145)
Total Bilirubin: 0.6 mg/dL (ref 0.2–1.2)
Total Protein: 7.5 g/dL (ref 6.0–8.3)

## 2020-03-11 LAB — TSH: TSH: 0.85 u[IU]/mL (ref 0.35–4.50)

## 2020-03-11 LAB — VITAMIN B12: Vitamin B-12: 394 pg/mL (ref 211–911)

## 2020-03-11 LAB — PSA: PSA: 0.46 ng/mL (ref 0.10–4.00)

## 2020-03-11 LAB — TESTOSTERONE: Testosterone: 348.88 ng/dL (ref 300.00–890.00)

## 2020-03-11 LAB — HEMOGLOBIN A1C: Hgb A1c MFr Bld: 6.7 % — ABNORMAL HIGH (ref 4.6–6.5)

## 2020-03-11 LAB — VITAMIN D 25 HYDROXY (VIT D DEFICIENCY, FRACTURES): VITD: 26.98 ng/mL — ABNORMAL LOW (ref 30.00–100.00)

## 2020-03-11 MED ORDER — METFORMIN HCL 500 MG PO TABS: 500.0000 mg | ORAL_TABLET | Freq: Every day | ORAL | 3 refills | Status: DC

## 2020-03-11 MED ORDER — HYDROCHLOROTHIAZIDE 12.5 MG PO TABS: 12.5000 mg | ORAL_TABLET | Freq: Every day | ORAL | 3 refills | Status: DC

## 2020-03-11 MED ORDER — AMLODIPINE BESYLATE 5 MG PO TABS: 5.0000 mg | ORAL_TABLET | Freq: Every day | ORAL | 3 refills | Status: DC

## 2020-03-11 MED ORDER — OMEPRAZOLE 40 MG PO CPDR: 40.0000 mg | DELAYED_RELEASE_CAPSULE | Freq: Every day | ORAL | 3 refills | Status: DC

## 2020-03-11 MED ORDER — ROSUVASTATIN CALCIUM 20 MG PO TABS: 20.0000 mg | ORAL_TABLET | Freq: Every day | ORAL | 3 refills | Status: DC

## 2020-03-11 NOTE — Progress Notes (Signed)
Flu shot has been logged.

## 2020-03-11 NOTE — Progress Notes (Signed)
Chief Complaint  Patient presents with  . Follow-up    pt is fasting and wants labs.   Annual 1. BP elevated on norvasc 5 mg qd and hctz 12.5 mg qd at home 120s/80s per pt given BP log today 2. HLD on crestor 20 mg qhs  3. DM 2 A1C 6.5 will check eye MD records 2021 [facility]  4. rec smoking cessation declines CT chest screen today  5. C/o fatigue weight statble     Review of Systems  Constitutional: Negative for weight loss.  HENT: Negative for hearing loss.   Eyes: Negative for blurred vision.  Respiratory: Negative for shortness of breath.   Cardiovascular: Negative for chest pain.  Gastrointestinal: Negative for abdominal pain.  Musculoskeletal: Negative for falls.  Skin: Negative for rash.  Neurological: Negative for headaches.  Psychiatric/Behavioral: Negative for depression.   Past Medical History:  Diagnosis Date  . GERD (gastroesophageal reflux disease)   . Hyperlipidemia   . Hypertension   . Low back pain    Past Surgical History:  Procedure Laterality Date  . COLONOSCOPY WITH PROPOFOL N/A 01/31/2017   Procedure: COLONOSCOPY WITH PROPOFOL;  Surgeon: Lucilla Lame, MD;  Location: [facility];  Service: Endoscopy;  Laterality: N/A;   Family History  Problem Relation Age of Onset  . Hypertension Mother   . Heart attack Mother   . Hypertension Father   . Cancer Father        prostate    Social History   Socioeconomic History  . Marital status: Married    Spouse name: Not on file  . Number of children: Not on file  . Years of education: Not on file  . Highest education level: Not on file  Occupational History  . Not on file  Tobacco Use  . Smoking status: Current Every Day Smoker    Packs/day: 0.50    Years: 30.00    Pack years: 15.00  . Smokeless tobacco: Never Used  Vaping Use  . Vaping Use: Never used  Substance and Sexual Activity  . Alcohol use: Yes    Comment: socially  . Drug use: No  . Sexual activity: Not on file  Other Topics  Concern  . Not on file  Social History Narrative   From [place]    Married 27 years as of 03/2019    9 kids 63 girls and 4 boys    Employed    Social Determinants of Radio broadcast assistant Strain:   . Difficulty of Paying Living Expenses: Not on file  Food Insecurity:   . Worried About Charity fundraiser in the Last Year: Not on file  . Ran Out of Food in the Last Year: Not on file  Transportation Needs:   . Lack of Transportation (Medical): Not on file  . Lack of Transportation (Non-Medical): Not on file  Physical Activity:   . Days of Exercise per Week: Not on file  . Minutes of Exercise per Session: Not on file  Stress:   . Feeling of Stress : Not on file  Social Connections:   . Frequency of Communication with Friends and Family: Not on file  . Frequency of Social Gatherings with Friends and Family: Not on file  . Attends Religious Services: Not on file  . Active Member of Clubs or Organizations: Not on file  . Attends Archivist Meetings: Not on file  . Marital Status: Not on file  Intimate Partner Violence:   . Fear  of Current or Ex-Partner: Not on file  . Emotionally Abused: Not on file  . Physically Abused: Not on file  . Sexually Abused: Not on file   Current Meds  Medication Sig  . amLODipine (NORVASC) 5 MG tablet Take 1 tablet (5 mg total) by mouth daily.  . Cholecalciferol 1.25 MG (50000 UT) capsule Take 1 capsule (50,000 Units total) by mouth once a week. D3 generic ok d/c after 6 months  . hydrochlorothiazide (HYDRODIURIL) 12.5 MG tablet Take 1 tablet (12.5 mg total) by mouth daily.  Marland Kitchen ketoconazole (NIZORAL) 2 % cream Apply 1 application topically 2 (two) times daily as needed for irritation.  . metFORMIN (GLUCOPHAGE) 500 MG tablet Take 1 tablet (500 mg total) by mouth daily with breakfast.  . omeprazole (PRILOSEC) 40 MG capsule Take 1 capsule (40 mg total) by mouth daily. 30 minutes before food  . rosuvastatin (CRESTOR) 20 MG tablet Take 1  tablet (20 mg total) by mouth at bedtime. appt further refills   Allergies  Allergen Reactions  . Lisinopril     Cough    No results found for this or any previous visit (from the past 2160 hour(s)). Objective  Body mass index is 24.46 kg/m. Wt Readings from Last 3 Encounters:  03/11/20 190 lb 9.6 oz (86.5 kg)  03/13/19 195 lb 9.6 oz (88.7 kg)  07/10/18 199 lb 6.4 oz (90.4 kg)   Temp Readings from Last 3 Encounters:  03/11/20 98.4 F (36.9 C)  03/13/19 98.1 F (36.7 C) (Oral)  07/10/18 98.6 F (37 C) (Oral)   BP Readings from Last 3 Encounters:  03/11/20 (!) 158/82  03/13/19 126/76  07/10/18 134/82   Pulse Readings from Last 3 Encounters:  03/11/20 73  03/13/19 81  07/10/18 73    Physical Exam Vitals and nursing note reviewed.  Constitutional:      Appearance: Normal appearance. He is well-developed and well-groomed.  HENT:     Head: Normocephalic and atraumatic.  Eyes:     Conjunctiva/sclera: Conjunctivae normal.     Pupils: Pupils are equal, round, and reactive to light.  Cardiovascular:     Rate and Rhythm: Normal rate and regular rhythm.     Heart sounds: Normal heart sounds. No murmur heard.   Pulmonary:     Effort: Pulmonary effort is normal.     Breath sounds: Normal breath sounds.  Abdominal:     Tenderness: There is no abdominal tenderness.  Skin:    General: Skin is warm and dry.  Neurological:     General: No focal deficit present.     Mental Status: He is alert and oriented to person, place, and time. Mental status is at baseline.     Gait: Gait normal.  Psychiatric:        Attention and Perception: Attention and perception normal.        Mood and Affect: Mood and affect normal.        Speech: Speech normal.        Behavior: Behavior normal. Behavior is cooperative.        Thought Content: Thought content normal.        Cognition and Memory: Cognition and memory normal.        Judgment: Judgment normal.     Assessment  Plan  Annual  physical exam Fasting labs today  Given flu vaccinetoday pna 23 vaccine utd moderna 2/2  Tdap had 2017  PSA check FH prostate cancer father  Colonoscopy had 01/31/17 +polpys f/u  in 5 years   Consider CT chest screening in future for lung cancer if at risk calc risk score in futurelow risk smoker 5-10 cig to <1/2 lasts x 2.5 days no FH lung cancer father had prostate cancer smoker since age 50/18 -rec smoking cessation -as of 03/11/20 will wait per pt   rec centrum Silver mens MVT  rec healthy diet and exercise  Fatigue, unspecified type - Plan: Comprehensive metabolic panel, CBC with Differential/Platelet, TSH, Urinalysis, Routine w reflex microscopic, Testosterone, Vitamin B12  Hyperlipidemia, unspecified hyperlipidemia type - Plan: Lipid panel  Hypertension associated with diabetes ([place]) controlled - Plan: Hemoglobin A1c Cont meds but consider add ARB low dose to HCTZ in 2 weeks  BP check in 2 weeks  Vitamin D deficiency - Plan: Vitamin D (25 hydroxy)  Special screening, prostate cancer - Plan: PSA  Hypogonadism in male - Plan: Testosterone  B12 deficiency - Plan: Vitamin B12   Type 2 diabetes mellitus w/HTN- Plan: Microalbumin / creatinine urine ratio A1C  On CCB could not tolerate ACEI  On statin  [facility]  Foot exam in future    Provider: Dr. Olivia Mackie McLean-Scocuzza-[facility] Medicine

## 2020-03-11 NOTE — Patient Instructions (Addendum)
Consider Mrs Deliah Boston spice   splenda or Stevia sugar  Debrox ear wax drops right ear x 1 week with ear flush warm water and hydrogen peroxide  Hypertension, Adult High blood pressure (hypertension) is when the force of blood pumping through the arteries is too strong. The arteries are the blood vessels that carry blood from the heart throughout the body. Hypertension forces the heart to work harder to pump blood and may cause arteries to become narrow or stiff. Untreated or uncontrolled hypertension can cause a heart attack, heart failure, a stroke, kidney disease, and other problems. A blood pressure reading consists of a higher number over a lower number. Ideally, your blood pressure should be below 120/80. The first ("top") number is called the systolic pressure. It is a measure of the pressure in your arteries as your heart beats. The second ("bottom") number is called the diastolic pressure. It is a measure of the pressure in your arteries as the heart relaxes. What are the causes? The exact cause of this condition is not known. There are some conditions that result in or are related to high blood pressure. What increases the risk? Some risk factors for high blood pressure are under your control. The following factors may make you more likely to develop this condition:  Smoking.  Having type 2 diabetes mellitus, high cholesterol, or both.  Not getting enough exercise or physical activity.  Being overweight.  Having too much fat, sugar, calories, or salt (sodium) in your diet.  Drinking too much alcohol. Some risk factors for high blood pressure may be difficult or impossible to change. Some of these factors include:  Having chronic kidney disease.  Having a family history of high blood pressure.  Age. Risk increases with age.  Race. You may be at higher risk if you are African American.  Gender. Men are at higher risk than women before age 66. After age 43, women are at higher  risk than men.  Having obstructive sleep apnea.  Stress. What are the signs or symptoms? High blood pressure may not cause symptoms. Very high blood pressure (hypertensive crisis) may cause:  Headache.  Anxiety.  Shortness of breath.  Nosebleed.  Nausea and vomiting.  Vision changes.  Severe chest pain.  Seizures. How is this diagnosed? This condition is diagnosed by measuring your blood pressure while you are seated, with your arm resting on a flat surface, your legs uncrossed, and your feet flat on the floor. The cuff of the blood pressure monitor will be placed directly against the skin of your upper arm at the level of your heart. It should be measured at least twice using the same arm. Certain conditions can cause a difference in blood pressure between your right and left arms. Certain factors can cause blood pressure readings to be lower or higher than normal for a short period of time:  When your blood pressure is higher when you are in a health care provider's office than when you are at home, this is called white coat hypertension. Most people with this condition do not need medicines.  When your blood pressure is higher at home than when you are in a health care provider's office, this is called masked hypertension. Most people with this condition may need medicines to control blood pressure. If you have a high blood pressure reading during one visit or you have normal blood pressure with other risk factors, you may be asked to:  Return on a different day to have  your blood pressure checked again.  Monitor your blood pressure at home for 1 week or longer. If you are diagnosed with hypertension, you may have other blood or imaging tests to help your health care provider understand your overall risk for other conditions. How is this treated? This condition is treated by making healthy lifestyle changes, such as eating healthy foods, exercising more, and reducing your  alcohol intake. Your health care provider may prescribe medicine if lifestyle changes are not enough to get your blood pressure under control, and if:  Your systolic blood pressure is above 130.  Your diastolic blood pressure is above 80. Your personal target blood pressure may vary depending on your medical conditions, your age, and other factors. Follow these instructions at home: Eating and drinking   Eat a diet that is high in fiber and potassium, and low in sodium, added sugar, and fat. An example eating plan is called the DASH (Dietary Approaches to Stop Hypertension) diet. To eat this way: ? Eat plenty of fresh fruits and vegetables. Try to fill one half of your plate at each meal with fruits and vegetables. ? Eat whole grains, such as whole-wheat pasta, brown rice, or whole-grain bread. Fill about one fourth of your plate with whole grains. ? Eat or drink low-fat dairy products, such as skim milk or low-fat yogurt. ? Avoid fatty cuts of meat, processed or cured meats, and poultry with skin. Fill about one fourth of your plate with lean proteins, such as fish, chicken without skin, beans, eggs, or tofu. ? Avoid pre-made and processed foods. These tend to be higher in sodium, added sugar, and fat.  Reduce your daily sodium intake. Most people with hypertension should eat less than 1,500 mg of sodium a day.  Do not drink alcohol if: ? Your health care provider tells you not to drink. ? You are pregnant, may be pregnant, or are planning to become pregnant.  If you drink alcohol: ? Limit how much you use to:  0-1 drink a day for women.  0-2 drinks a day for men. ? Be aware of how much alcohol is in your drink. In the [place] one drink equals one 12 oz bottle of beer (355 mL), one 5 oz glass of wine (148 mL), or one 1 oz glass of hard liquor (44 mL). Lifestyle   Work with your health care provider to maintain a healthy body weight or to lose weight. Ask what an ideal weight is for  you.  Get at least 30 minutes of exercise most days of the week. Activities may include walking, swimming, or biking.  Include exercise to strengthen your muscles (resistance exercise), such as Pilates or lifting weights, as part of your weekly exercise routine. Try to do these types of exercises for 30 minutes at least 3 days a week.  Do not use any products that contain nicotine or tobacco, such as cigarettes, e-cigarettes, and chewing tobacco. If you need help quitting, ask your health care provider.  Monitor your blood pressure at home as told by your health care provider.  Keep all follow-up visits as told by your health care provider. This is important. Medicines  Take over-the-counter and prescription medicines only as told by your health care provider. Follow directions carefully. Blood pressure medicines must be taken as prescribed.  Do not skip doses of blood pressure medicine. Doing this puts you at risk for problems and can make the medicine less effective.  Ask your health care provider about  side effects or reactions to medicines that you should watch for. Contact a health care provider if you:  Think you are having a reaction to a medicine you are taking.  Have headaches that keep coming back (recurring).  Feel dizzy.  Have swelling in your ankles.  Have trouble with your vision. Get help right away if you:  Develop a severe headache or confusion.  Have unusual weakness or numbness.  Feel faint.  Have severe pain in your chest or abdomen.  Vomit repeatedly.  Have trouble breathing. Summary  Hypertension is when the force of blood pumping through your arteries is too strong. If this condition is not controlled, it may put you at risk for serious complications.  Your personal target blood pressure may vary depending on your medical conditions, your age, and other factors. For most people, a normal blood pressure is less than 120/80.  Hypertension is  treated with lifestyle changes, medicines, or a combination of both. Lifestyle changes include losing weight, eating a healthy, low-sodium diet, exercising more, and limiting alcohol. This information is not intended to replace advice given to you by your health care provider. Make sure you discuss any questions you have with your health care provider. Document Revised: 01/31/2018 Document Reviewed: 01/31/2018 Elsevier Patient Education  [place] DASH stands for "Dietary Approaches to Stop Hypertension." The DASH eating plan is a healthy eating plan that has been shown to reduce high blood pressure (hypertension). It may also reduce your risk for type 2 diabetes, heart disease, and stroke. The DASH eating plan may also help with weight loss. What are tips for following this plan?  General guidelines  Avoid eating more than 2,300 mg (milligrams) of salt (sodium) a day. If you have hypertension, you may need to reduce your sodium intake to 1,500 mg a day.  Limit alcohol intake to no more than 1 drink a day for nonpregnant women and 2 drinks a day for men. One drink equals 12 oz of beer, 5 oz of wine, or 1 oz of hard liquor.  Work with your health care provider to maintain a healthy body weight or to lose weight. Ask what an ideal weight is for you.  Get at least 30 minutes of exercise that causes your heart to beat faster (aerobic exercise) most days of the week. Activities may include walking, swimming, or biking.  Work with your health care provider or diet and nutrition specialist (dietitian) to adjust your eating plan to your individual calorie needs. Reading food labels   Check food labels for the amount of sodium per serving. Choose foods with less than 5 percent of the Daily Value of sodium. Generally, foods with less than 300 mg of sodium per serving fit into this eating plan.  To find whole grains, look for the word "whole" as the first word in the  ingredient list. Shopping  Buy products labeled as "low-sodium" or "no salt added."  Buy fresh foods. Avoid canned foods and premade or frozen meals. Cooking  Avoid adding salt when cooking. Use salt-free seasonings or herbs instead of table salt or sea salt. Check with your health care provider or pharmacist before using salt substitutes.  Do not fry foods. Cook foods using healthy methods such as baking, boiling, grilling, and broiling instead.  Cook with heart-healthy oils, such as olive, canola, soybean, or sunflower oil. Meal planning  Eat a balanced diet that includes: ? 5 or more servings of fruits and vegetables  each day. At each meal, try to fill half of your plate with fruits and vegetables. ? Up to 6-8 servings of whole grains each day. ? Less than 6 oz of lean meat, poultry, or fish each day. A 3-oz serving of meat is about the same size as a deck of cards. One egg equals 1 oz. ? 2 servings of low-fat dairy each day. ? A serving of nuts, seeds, or beans 5 times each week. ? Heart-healthy fats. Healthy fats called Omega-3 fatty acids are found in foods such as flaxseeds and coldwater fish, like sardines, salmon, and mackerel.  Limit how much you eat of the following: ? Canned or prepackaged foods. ? Food that is high in trans fat, such as fried foods. ? Food that is high in saturated fat, such as fatty meat. ? Sweets, desserts, sugary drinks, and other foods with added sugar. ? Full-fat dairy products.  Do not salt foods before eating.  Try to eat at least 2 vegetarian meals each week.  Eat more home-cooked food and less restaurant, buffet, and fast food.  When eating at a restaurant, ask that your food be prepared with less salt or no salt, if possible. What foods are recommended? The items listed may not be a complete list. Talk with your dietitian about what dietary choices are best for you. Grains Whole-grain or whole-wheat bread. Whole-grain or whole-wheat  pasta. Brown rice. [place]. Bulgur. Whole-grain and low-sodium cereals. Pita bread. Low-fat, low-sodium crackers. Whole-wheat flour tortillas. Vegetables Fresh or frozen vegetables (raw, steamed, roasted, or grilled). Low-sodium or reduced-sodium tomato and vegetable juice. Low-sodium or reduced-sodium tomato sauce and tomato paste. Low-sodium or reduced-sodium canned vegetables. Fruits All fresh, dried, or frozen fruit. Canned fruit in natural juice (without added sugar). Meat and other protein foods Skinless chicken or [place]. Ground chicken or [place]. Pork with fat trimmed off. Fish and seafood. Egg whites. Dried beans, peas, or lentils. Unsalted nuts, nut butters, and seeds. Unsalted canned beans. Lean cuts of beef with fat trimmed off. Low-sodium, lean deli meat. Dairy Low-fat (1%) or fat-free (skim) milk. Fat-free, low-fat, or reduced-fat cheeses. Nonfat, low-sodium ricotta or cottage cheese. Low-fat or nonfat yogurt. Low-fat, low-sodium cheese. Fats and oils Soft margarine without trans fats. Vegetable oil. Low-fat, reduced-fat, or light mayonnaise and salad dressings (reduced-sodium). Canola, safflower, olive, soybean, and sunflower oils. Avocado. Seasoning and other foods Herbs. Spices. Seasoning mixes without salt. Unsalted popcorn and pretzels. Fat-free sweets. What foods are not recommended? The items listed may not be a complete list. Talk with your dietitian about what dietary choices are best for you. Grains Baked goods made with fat, such as croissants, muffins, or some breads. Dry pasta or rice meal packs. Vegetables Creamed or fried vegetables. Vegetables in a cheese sauce. Regular canned vegetables (not low-sodium or reduced-sodium). Regular canned tomato sauce and paste (not low-sodium or reduced-sodium). Regular tomato and vegetable juice (not low-sodium or reduced-sodium). Angie Fava. Olives. Fruits Canned fruit in a light or heavy syrup. Fried fruit. Fruit in cream or  butter sauce. Meat and other protein foods Fatty cuts of meat. Ribs. Fried meat. Berniece Salines. Sausage. Bologna and other processed lunch meats. Salami. Fatback. Hotdogs. Bratwurst. Salted nuts and seeds. Canned beans with added salt. Canned or smoked fish. Whole eggs or egg yolks. Chicken or [place] with skin. Dairy Whole or 2% milk, cream, and half-and-half. Whole or full-fat cream cheese. Whole-fat or sweetened yogurt. Full-fat cheese. Nondairy creamers. Whipped toppings. Processed cheese and cheese spreads. Fats and oils Butter.  Stick margarine. Lard. Shortening. Ghee. Bacon fat. Tropical oils, such as coconut, palm kernel, or palm oil. Seasoning and other foods Salted popcorn and pretzels. Onion salt, garlic salt, seasoned salt, table salt, and sea salt. Worcestershire sauce. Tartar sauce. Barbecue sauce. Teriyaki sauce. Soy sauce, including reduced-sodium. Steak sauce. Canned and packaged gravies. Fish sauce. Oyster sauce. Cocktail sauce. Horseradish that you find on the shelf. Ketchup. Mustard. Meat flavorings and tenderizers. Bouillon cubes. Hot sauce and Tabasco sauce. Premade or packaged marinades. Premade or packaged taco seasonings. Relishes. Regular salad dressings. Where to find more information:  National Heart, Lung, and [place]: https://wilson-eaton.com/  American Heart Association: www.heart.org Summary  The DASH eating plan is a healthy eating plan that has been shown to reduce high blood pressure (hypertension). It may also reduce your risk for type 2 diabetes, heart disease, and stroke.  With the DASH eating plan, you should limit salt (sodium) intake to 2,300 mg a day. If you have hypertension, you may need to reduce your sodium intake to 1,500 mg a day.  When on the DASH eating plan, aim to eat more fresh fruits and vegetables, whole grains, lean proteins, low-fat dairy, and heart-healthy fats.  Work with your health care provider or diet and nutrition specialist (dietitian) to  adjust your eating plan to your individual calorie needs. This information is not intended to replace advice given to you by your health care provider. Make sure you discuss any questions you have with your health care provider. Document Revised: 05/05/2017 Document Reviewed: 05/16/2016 Elsevier Patient Education  2020 [employer].

## 2020-03-13 ENCOUNTER — Telehealth: Payer: Self-pay | Admitting: Internal Medicine

## 2020-03-13 LAB — URINALYSIS, ROUTINE W REFLEX MICROSCOPIC
Bilirubin Urine: NEGATIVE
Glucose, UA: NEGATIVE
Hgb urine dipstick: NEGATIVE
Ketones, ur: NEGATIVE
Leukocytes,Ua: NEGATIVE
Nitrite: NEGATIVE
Protein, ur: NEGATIVE
Specific Gravity, Urine: 1.015 (ref 1.001–1.03)
pH: 7 (ref 5.0–8.0)

## 2020-03-13 NOTE — Telephone Encounter (Signed)
-----   Message from Delorise Jackson, MD sent at 03/11/2020 10:46 AM EDT ----- [facility]

## 2020-03-13 NOTE — Telephone Encounter (Signed)
Request sent via epic routing  

## 2020-03-15 ENCOUNTER — Other Ambulatory Visit: Payer: Self-pay | Admitting: Internal Medicine

## 2020-03-16 ENCOUNTER — Telehealth: Payer: Self-pay | Admitting: Internal Medicine

## 2020-03-16 NOTE — Telephone Encounter (Signed)
Patient informed and verbalized understanding of lab results  

## 2020-03-16 NOTE — Telephone Encounter (Signed)
Pt called to get lab results  Please call after 2

## 2020-03-17 ENCOUNTER — Encounter: Payer: Self-pay | Admitting: Internal Medicine

## 2020-03-25 ENCOUNTER — Telehealth: Payer: Self-pay | Admitting: Internal Medicine

## 2020-03-25 ENCOUNTER — Ambulatory Visit (INDEPENDENT_AMBULATORY_CARE_PROVIDER_SITE_OTHER): Payer: Managed Care, Other (non HMO)

## 2020-03-25 ENCOUNTER — Other Ambulatory Visit: Payer: Self-pay

## 2020-03-25 VITALS — BP 135/82 | HR 74

## 2020-03-25 DIAGNOSIS — I10 Essential (primary) hypertension: Secondary | ICD-10-CM

## 2020-03-25 DIAGNOSIS — E1159 Type 2 diabetes mellitus with other circulatory complications: Secondary | ICD-10-CM

## 2020-03-25 DIAGNOSIS — I152 Hypertension secondary to endocrine disorders: Secondary | ICD-10-CM

## 2020-03-25 NOTE — Telephone Encounter (Signed)
BP still elevated is he agreeable to add losartan to hctz?

## 2020-03-25 NOTE — Progress Notes (Signed)
Patient is here for a BP check due to bp being high at last visit, as per patient.  Currently patients BP is 135/82 and BPM is 74.  Patient has no complaints of headaches, blurry vision, chest pain, arm pain, light headedness, dizziness, and nor jaw pain. Please see previous note for order.

## 2020-03-27 NOTE — Telephone Encounter (Signed)
Results have been received and abstracted

## 2020-03-27 NOTE — Telephone Encounter (Signed)
Left message to return call 

## 2020-03-31 NOTE — Telephone Encounter (Signed)
Previously spoke with Patient and he is agreeable to adding new medication.   The previous message did not save to Epic and send.   This is the second message.

## 2020-04-01 ENCOUNTER — Other Ambulatory Visit: Payer: Self-pay | Admitting: Internal Medicine

## 2020-04-01 DIAGNOSIS — I10 Essential (primary) hypertension: Secondary | ICD-10-CM

## 2020-04-01 MED ORDER — LOSARTAN POTASSIUM-HCTZ 50-12.5 MG PO TABS: 0.5000 | ORAL_TABLET | Freq: Every day | ORAL | 3 refills | Status: DC

## 2020-04-01 NOTE — Telephone Encounter (Signed)
Left message to return call 

## 2020-04-01 NOTE — Telephone Encounter (Signed)
Spoke with patient and he was informed of medication instructions. Patient scheduled for 05/05/20 at 4:00 pm in office

## 2020-04-01 NOTE — Telephone Encounter (Signed)
Sch f/u in person sooner than 09/2020 in 2-4 weeks after staring new medication and keep 09/2020 appt for now Losartan hctz 50-12.5 in the am stop hctz 12.5 alone He is to take 1/2 pill in the am x 3 days monitor bp and if elevated take 1 pill day 4 of combination medication   Inform  Thanks Wahneta

## 2020-05-05 ENCOUNTER — Ambulatory Visit: Payer: Managed Care, Other (non HMO) | Admitting: Internal Medicine

## 2020-06-09 ENCOUNTER — Telehealth: Payer: Self-pay | Admitting: Internal Medicine

## 2020-06-09 NOTE — Telephone Encounter (Signed)
Pt called to cancel 06/10/20 appt  He was around his grandson who tested positive for covid  He is not having any symptoms at this time

## 2020-06-10 ENCOUNTER — Ambulatory Visit: Payer: Managed Care, Other (non HMO) | Admitting: Internal Medicine

## 2020-06-10 NOTE — Telephone Encounter (Signed)
Noted  For your information   

## 2020-06-10 NOTE — Telephone Encounter (Signed)
Patient informed and verbalized understanding

## 2020-06-10 NOTE — Telephone Encounter (Signed)
Rec be tested in 5 days  Sch for pt if wanted or can go [facility] urgent care

## 2020-09-09 ENCOUNTER — Ambulatory Visit: Payer: Managed Care, Other (non HMO) | Admitting: Internal Medicine

## 2020-09-09 ENCOUNTER — Other Ambulatory Visit: Payer: Self-pay

## 2020-09-09 ENCOUNTER — Encounter: Payer: Self-pay | Admitting: Internal Medicine

## 2020-09-09 VITALS — BP 138/80 | HR 74 | Temp 98.3°F | Ht 74.02 in | Wt 196.0 lb

## 2020-09-09 DIAGNOSIS — E119 Type 2 diabetes mellitus without complications: Secondary | ICD-10-CM

## 2020-09-09 DIAGNOSIS — K219 Gastro-esophageal reflux disease without esophagitis: Secondary | ICD-10-CM

## 2020-09-09 DIAGNOSIS — Z720 Tobacco use: Secondary | ICD-10-CM

## 2020-09-09 DIAGNOSIS — E785 Hyperlipidemia, unspecified: Secondary | ICD-10-CM

## 2020-09-09 DIAGNOSIS — E1159 Type 2 diabetes mellitus with other circulatory complications: Secondary | ICD-10-CM

## 2020-09-09 DIAGNOSIS — E559 Vitamin D deficiency, unspecified: Secondary | ICD-10-CM

## 2020-09-09 DIAGNOSIS — R195 Other fecal abnormalities: Secondary | ICD-10-CM

## 2020-09-09 DIAGNOSIS — Z122 Encounter for screening for malignant neoplasm of respiratory organs: Secondary | ICD-10-CM

## 2020-09-09 DIAGNOSIS — I152 Hypertension secondary to endocrine disorders: Secondary | ICD-10-CM

## 2020-09-09 DIAGNOSIS — I10 Essential (primary) hypertension: Secondary | ICD-10-CM

## 2020-09-09 MED ORDER — METFORMIN HCL 500 MG PO TABS: 500.0000 mg | ORAL_TABLET | Freq: Every day | ORAL | 3 refills | Status: DC

## 2020-09-09 MED ORDER — OMEPRAZOLE 40 MG PO CPDR: 40.0000 mg | DELAYED_RELEASE_CAPSULE | Freq: Every day | ORAL | 3 refills | Status: DC

## 2020-09-09 MED ORDER — AMLODIPINE BESYLATE 10 MG PO TABS: 10.0000 mg | ORAL_TABLET | Freq: Every day | ORAL | 3 refills | Status: DC

## 2020-09-09 MED ORDER — ROSUVASTATIN CALCIUM 20 MG PO TABS: 20.0000 mg | ORAL_TABLET | Freq: Every day | ORAL | 3 refills | Status: DC

## 2020-09-09 MED ORDER — LOSARTAN POTASSIUM-HCTZ 50-12.5 MG PO TABS: 0.5000 | ORAL_TABLET | Freq: Every day | ORAL | 3 refills | Status: DC

## 2020-09-09 NOTE — Patient Instructions (Addendum)
Eye exam due 09/15/20 call for an appointment  moderna due 11/07/20   We will refer you to GI to check you intestines Dr. Allen Norris CT scan of your chest

## 2020-09-09 NOTE — Progress Notes (Signed)
Chief Complaint  Patient presents with  . Follow-up   F/u  1. htn sl elevated on norvasc 5 mg qd hyzaar 50-12.5 mg qd he is taking meds  Will adjust meds  2 DM 2 on metformin 500 mg qd  3. C/o foul odor after having bowel movements will refer to GI Dr. Allen Norris to address and discus ? Bacterial overgrowth of gut this happens 30 min after stool and even after showering he denies constipation/diarrhea 4. Going to [place] soon and hasnt been in 52 years or seen 68 y.o son he has 10 kids  30. Tobacco abuse down to 1 cig qd trying to quit    Review of Systems  Constitutional: Negative for weight loss.  HENT: Negative for hearing loss.   Eyes: Negative for blurred vision.  Respiratory: Negative for shortness of breath.   Cardiovascular: Negative for chest pain.  Gastrointestinal: Negative for abdominal pain.  Musculoskeletal: Negative for falls and joint pain.  Skin: Negative for rash.  Neurological: Negative for headaches.  Psychiatric/Behavioral: Negative for depression.   Past Medical History:  Diagnosis Date  . GERD (gastroesophageal reflux disease)   . Hyperlipidemia   . Hypertension   . Low back pain    Past Surgical History:  Procedure Laterality Date  . COLONOSCOPY WITH PROPOFOL N/A 01/31/2017   Procedure: COLONOSCOPY WITH PROPOFOL;  Surgeon: Lucilla Lame, MD;  Location: [facility] ENDOSCOPY;  Service: Endoscopy;  Laterality: N/A;   Family History  Problem Relation Age of Onset  . Hypertension Mother   . Heart attack Mother   . Hypertension Father   . Cancer Father        prostate    Social History   Socioeconomic History  . Marital status: Married    Spouse name: Not on file  . Number of children: Not on file  . Years of education: Not on file  . Highest education level: Not on file  Occupational History  . Not on file  Tobacco Use  . Smoking status: Current Every Day Smoker    Packs/day: 0.50    Years: 30.00    Pack years: 15.00  . Smokeless tobacco: Never Used   Vaping Use  . Vaping Use: Never used  Substance and Sexual Activity  . Alcohol use: Yes    Comment: socially  . Drug use: No  . Sexual activity: Not on file  Other Topics Concern  . Not on file  Social History Narrative   From [place]    Married 27 years as of 03/2019    9 kids 63 girls and 4 boys    Employed    Social Determinants of Radio broadcast assistant Strain: Not on file  Food Insecurity: Not on file  Transportation Needs: Not on file  Physical Activity: Not on file  Stress: Not on file  Social Connections: Not on file  Intimate Partner Violence: Not on file   Current Meds  Medication Sig  . amLODipine (NORVASC) 10 MG tablet Take 1 tablet (10 mg total) by mouth daily.  Marland Kitchen ketoconazole (NIZORAL) 2 % cream Apply 1 application topically 2 (two) times daily as needed for irritation.  . [DISCONTINUED] amLODipine (NORVASC) 5 MG tablet Take 1 tablet (5 mg total) by mouth daily.  . [DISCONTINUED] losartan-hydrochlorothiazide (HYZAAR) 50-12.5 MG tablet Take 0.5 tablets by mouth daily. In am if BP >130/>80 after 3 days start 1 pill daily in the am. D/c hctz 12.5 alone  . [DISCONTINUED] metFORMIN (GLUCOPHAGE) 500 MG tablet Take  1 tablet (500 mg total) by mouth daily with breakfast.  . [DISCONTINUED] omeprazole (PRILOSEC) 40 MG capsule Take 1 capsule (40 mg total) by mouth daily. 30 minutes before food  . [DISCONTINUED] rosuvastatin (CRESTOR) 20 MG tablet Take 1 tablet (20 mg total) by mouth at bedtime. appt further refills   Allergies  Allergen Reactions  . Lisinopril     Cough    No results found for this or any previous visit (from the past 2160 hour(s)). Objective  Body mass index is 25.15 kg/m. Wt Readings from Last 3 Encounters:  09/09/20 196 lb (88.9 kg)  03/11/20 190 lb 9.6 oz (86.5 kg)  03/13/19 195 lb 9.6 oz (88.7 kg)   Temp Readings from Last 3 Encounters:  09/09/20 98.3 F (36.8 C) (Oral)  03/11/20 98.4 F (36.9 C)  03/13/19 98.1 F (36.7 C) (Oral)    BP Readings from Last 3 Encounters:  09/09/20 138/80  03/25/20 135/82  03/11/20 (!) 158/82   Pulse Readings from Last 3 Encounters:  09/09/20 74  03/25/20 74  03/11/20 73    Physical Exam Vitals and nursing note reviewed.  Constitutional:      Appearance: Normal appearance. He is well-developed and well-groomed.  HENT:     Head: Normocephalic and atraumatic.  Eyes:     Conjunctiva/sclera: Conjunctivae normal.     Pupils: Pupils are equal, round, and reactive to light.  Cardiovascular:     Rate and Rhythm: Normal rate and regular rhythm.     Heart sounds: Normal heart sounds. No murmur heard.   Pulmonary:     Effort: Pulmonary effort is normal.     Breath sounds: Normal breath sounds.  Abdominal:     Tenderness: There is no abdominal tenderness.  Skin:    General: Skin is warm and dry.  Neurological:     General: No focal deficit present.     Mental Status: He is alert and oriented to person, place, and time.     Gait: Gait normal.  Psychiatric:        Attention and Perception: Attention and perception normal.        Mood and Affect: Mood and affect normal.        Speech: Speech normal.        Behavior: Behavior normal. Behavior is cooperative.        Thought Content: Thought content normal.        Cognition and Memory: Cognition and memory normal.        Judgment: Judgment normal.     Assessment  Plan  Hypertension associated with diabetes (HCC)  norvasc 5 mg qd will increase to 10  hyzaar 50-12.5 mg qd Metformin 500 mg qd  Call and sch eye MD appt [facility] due 09/15/20   Hyperlipidemia, unspecified hyperlipidemia type - Plan: rosuvastatin (CRESTOR) 20 MG tablet  Gastroesophageal reflux disease without esophagitis - Plan: omeprazole (PRILOSEC) 40 MG capsule  Strong odor of stools  -refer to GI Dr. Allen Norris needs appt after 10/03/20   HM Fasting labs upcoming Flu shot utd  pna 23 vaccine utd moderna 3/3 consider booster Tdap [id number]  PSAcheck FH  prostate cancer father 0.46 03/11/20   Colonoscopy had 01/31/17 +polpys f/u in 5 years  Dr. Allen Norris   Consider CT chest screening in future for lung cancer if at risk calc risk score in futurelow risk smoker 5-10cig to <1/2lasts x 2.5 daysno FH lung cancer father had prostate cancer smoker since age 57/18 -rec smoking cessation -as  of 09/10/20 referred    rec centrum Silver mens MVT rec healthy diet and exercise Provider: Dr. Olivia Mackie McLean-Scocuzza-Internal Medicine

## 2020-09-14 ENCOUNTER — Encounter: Payer: Self-pay | Admitting: *Deleted

## 2020-09-15 ENCOUNTER — Encounter: Payer: Self-pay | Admitting: *Deleted

## 2020-09-15 ENCOUNTER — Telehealth: Payer: Self-pay | Admitting: *Deleted

## 2020-09-15 DIAGNOSIS — F17200 Nicotine dependence, unspecified, uncomplicated: Secondary | ICD-10-CM

## 2020-09-15 DIAGNOSIS — Z87891 Personal history of nicotine dependence: Secondary | ICD-10-CM

## 2020-09-15 DIAGNOSIS — Z122 Encounter for screening for malignant neoplasm of respiratory organs: Secondary | ICD-10-CM

## 2020-09-15 NOTE — Addendum Note (Signed)
Addended by: Livia Snellen on: 09/15/2020 12:50 PM   Modules accepted: Orders

## 2020-09-15 NOTE — Telephone Encounter (Signed)
Received referral for low dose lung cancer screening CT scan. Message left at phone number listed in EMR for patient to call me back to facilitate scheduling scan.  

## 2020-09-15 NOTE — Telephone Encounter (Signed)
Scan is scheduled for 1:00 pm (not 2:00 pm, as previously documented)

## 2020-09-15 NOTE — Telephone Encounter (Signed)
Patient returned my call and provided smoking history,(current every day smoker, 1 ppd x 30 yrs.) as well as answering questions related to screening process. Patient denies signs of lung cancer such as weight loss or hemoptysis. Patient denies comorbidity that would prevent curative treatment if lung cancer were found. Patient is scheduled for shared decision making visit and CT scan on 10/20/20 @ 2:00pm.

## 2020-10-16 ENCOUNTER — Other Ambulatory Visit: Payer: Self-pay

## 2020-10-16 ENCOUNTER — Telehealth: Payer: Self-pay | Admitting: *Deleted

## 2020-10-16 ENCOUNTER — Other Ambulatory Visit (INDEPENDENT_AMBULATORY_CARE_PROVIDER_SITE_OTHER): Payer: Managed Care, Other (non HMO)

## 2020-10-16 DIAGNOSIS — I152 Hypertension secondary to endocrine disorders: Secondary | ICD-10-CM

## 2020-10-16 DIAGNOSIS — E1159 Type 2 diabetes mellitus with other circulatory complications: Secondary | ICD-10-CM | POA: Diagnosis not present

## 2020-10-16 DIAGNOSIS — E559 Vitamin D deficiency, unspecified: Secondary | ICD-10-CM

## 2020-10-16 LAB — LIPID PANEL
Cholesterol: 96 mg/dL (ref 0–200)
HDL: 50.8 mg/dL (ref >39.00)
LDL Cholesterol: 32 mg/dL (ref 0–99)
NonHDL: 45.24
Total CHOL/HDL Ratio: 2
Triglycerides: 64 mg/dL (ref 0.0–149.0)
VLDL: 12.8 mg/dL (ref 0.0–40.0)

## 2020-10-16 LAB — COMPREHENSIVE METABOLIC PANEL
ALT: 12 U/L (ref 0–53)
AST: 15 U/L (ref 0–37)
Albumin: 4.5 g/dL (ref 3.5–5.2)
Alkaline Phosphatase: 71 U/L (ref 39–117)
BUN: 13 mg/dL (ref 6–23)
CO2: 28 mEq/L (ref 19–32)
Calcium: 9.6 mg/dL (ref 8.4–10.5)
Chloride: 105 mEq/L (ref 96–112)
Creatinine, Ser: 1.19 mg/dL (ref 0.40–1.50)
GFR: 65.86 mL/min (ref >60.00)
Glucose, Bld: 105 mg/dL — ABNORMAL HIGH (ref 70–99)
Potassium: 4.2 mEq/L (ref 3.5–5.1)
Sodium: 141 mEq/L (ref 135–145)
Total Bilirubin: 0.5 mg/dL (ref 0.2–1.2)
Total Protein: 7.1 g/dL (ref 6.0–8.3)

## 2020-10-16 LAB — CBC WITH DIFFERENTIAL/PLATELET
Basophils Absolute: 0 10*3/uL (ref 0.0–0.1)
Basophils Relative: 0.3 % (ref 0.0–3.0)
Eosinophils Absolute: 0.2 10*3/uL (ref 0.0–0.7)
Eosinophils Relative: 2.8 % (ref 0.0–5.0)
HCT: 43.1 % (ref 39.0–52.0)
Hemoglobin: 14.3 g/dL (ref 13.0–17.0)
Lymphocytes Relative: 28.3 % (ref 12.0–46.0)
Lymphs Abs: 1.7 10*3/uL (ref 0.7–4.0)
MCHC: 33.1 g/dL (ref 30.0–36.0)
MCV: 84.1 fl (ref 78.0–100.0)
Monocytes Absolute: 0.6 10*3/uL (ref 0.1–1.0)
Monocytes Relative: 9.4 % (ref 3.0–12.0)
Neutro Abs: 3.6 10*3/uL (ref 1.4–7.7)
Neutrophils Relative %: 59.2 % (ref 43.0–77.0)
Platelets: 181 10*3/uL (ref 150.0–400.0)
RBC: 5.12 Mil/uL (ref 4.22–5.81)
RDW: 14.3 % (ref 11.5–15.5)
WBC: 6.1 10*3/uL (ref 4.0–10.5)

## 2020-10-16 LAB — VITAMIN D 25 HYDROXY (VIT D DEFICIENCY, FRACTURES): VITD: 47.64 ng/mL (ref 30.00–100.00)

## 2020-10-16 LAB — HEMOGLOBIN A1C: Hgb A1c MFr Bld: 6.4 % (ref 4.6–6.5)

## 2020-10-16 NOTE — Telephone Encounter (Signed)
Per authorization team, pt's current insurance is OON and accepted at [facility]. Will need to cancel his appt next week and referred to facility that is in network. Pt made aware and he stated that he will be getting new insurance in the next month and would like to reschedule his appts within our program if in network. Informed pt that will notify Shawn to reschedule appts once new insurance has been received. Pt verbalized understanding.

## 2020-10-20 ENCOUNTER — Ambulatory Visit: Payer: Managed Care, Other (non HMO)

## 2020-10-20 ENCOUNTER — Inpatient Hospital Stay: Payer: Managed Care, Other (non HMO) | Admitting: Nurse Practitioner

## 2020-12-10 ENCOUNTER — Other Ambulatory Visit: Payer: Self-pay

## 2020-12-10 ENCOUNTER — Encounter: Payer: Self-pay | Admitting: Internal Medicine

## 2020-12-10 ENCOUNTER — Telehealth (INDEPENDENT_AMBULATORY_CARE_PROVIDER_SITE_OTHER): Payer: 59 | Admitting: Internal Medicine

## 2020-12-10 VITALS — Ht 74.02 in | Wt 198.0 lb

## 2020-12-10 DIAGNOSIS — Z720 Tobacco use: Secondary | ICD-10-CM

## 2020-12-10 DIAGNOSIS — I152 Hypertension secondary to endocrine disorders: Secondary | ICD-10-CM

## 2020-12-10 DIAGNOSIS — J069 Acute upper respiratory infection, unspecified: Secondary | ICD-10-CM

## 2020-12-10 DIAGNOSIS — E1159 Type 2 diabetes mellitus with other circulatory complications: Secondary | ICD-10-CM | POA: Diagnosis not present

## 2020-12-10 MED ORDER — AZITHROMYCIN 250 MG PO TABS: ORAL_TABLET | ORAL | 0 refills | Status: AC

## 2020-12-10 NOTE — Progress Notes (Signed)
Telephone Note  I connected with Anthony Harding  on 12/10/20 at  3:00 PM EDT by a telephone and verified that I am speaking with the correct person using two identifiers.  Location patient: home, [place] Location provider:work or home office Persons participating in the virtual visit: patient, provider  I discussed the limitations of evaluation and management by telemedicine and the availability of in person appointments. The patient expressed understanding and agreed to proceed.   HPI:  Acute telemedicine visit for : Htn on hyzaar 50-12.5 mg qd norvasc 10 mg qd will call back with BP readings  Hld on crestor 20 mg qhs  URI sx's GS 60 y.o was sick with cold uri not covid and he tested neg covid +smoker tried cough drops and herb from [place]   -COVID-19 vaccine status: 3/3 moderna  ROS: See pertinent positives and negatives per HPI.  Past Medical History:  Diagnosis Date   GERD (gastroesophageal reflux disease)    Hyperlipidemia    Hypertension    Low back pain     Past Surgical History:  Procedure Laterality Date   COLONOSCOPY WITH PROPOFOL N/A 01/31/2017   Procedure: COLONOSCOPY WITH PROPOFOL;  Surgeon: Lucilla Lame, MD;  Location: [facility] ENDOSCOPY;  Service: Endoscopy;  Laterality: N/A;     Current Outpatient Medications:    amLODipine (NORVASC) 10 MG tablet, Take 1 tablet (10 mg total) by mouth daily., Disp: 90 tablet, Rfl: 3   azithromycin (ZITHROMAX) 250 MG tablet, Take 2 tablets on day 1, then 1 tablet daily on days 2 through 5 with food, Disp: 6 tablet, Rfl: 0   ketoconazole (NIZORAL) 2 % cream, Apply 1 application topically 2 (two) times daily as needed for irritation., Disp: 60 g, Rfl: 11   losartan-hydrochlorothiazide (HYZAAR) 50-12.5 MG tablet, Take 0.5 tablets by mouth daily. In am if BP >130/>80 after 3 days start 1 pill daily in the am. D/c hctz 12.5 alone, Disp: 90 tablet, Rfl: 3   metFORMIN (GLUCOPHAGE) 500 MG tablet, Take 1 tablet (500 mg total) by mouth daily with  breakfast., Disp: 90 tablet, Rfl: 3   omeprazole (PRILOSEC) 40 MG capsule, Take 1 capsule (40 mg total) by mouth daily. 30 minutes before food, Disp: 90 capsule, Rfl: 3   rosuvastatin (CRESTOR) 20 MG tablet, Take 1 tablet (20 mg total) by mouth at bedtime., Disp: 90 tablet, Rfl: 3  EXAM:  VITALS per patient if applicable:  GENERAL: alert, oriented, appears well and in no acute distress   PSYCH/NEURO: pleasant and cooperative, no obvious depression or anxiety, speech and thought processing grossly intact  ASSESSMENT AND PLAN:  Discussed the following assessment and plan:  Hypertension associated with diabetes (HCC) A1C 6.4, HLD Cont norvasc 10 mg qd, hzaar 50-12.5 mg qd crestor 20 mg qhs had labs 10/2020  Metformin 500 mg qd  Upper respiratory tract infection - Plan: azithromycin (ZITHROMAX) 250 MG tablet Tobacco abuse - Plan: azithromycin (ZITHROMAX) 250 MG tablet Rec smoking cessation  Covid was negative   -we discussed possible serious and likely etiologies, options for evaluation and workup, limitations of telemedicine visit vs in person visit, treatment, treatment risks and precautions. Pt prefers to treat via telemedicine empirically rather than in person at this moment.   I discussed the assessment and treatment plan with the patient. The patient was provided an opportunity to ask questions and all were answered. The patient agreed with the plan and demonstrated an understanding of the instructions.    Time spent 20 min Hydro,  MD

## 2020-12-10 NOTE — Addendum Note (Signed)
Addended by: Orland Mustard on: 12/10/2020 03:34 PM   Modules accepted: Level of Service

## 2020-12-10 NOTE — Patient Instructions (Addendum)
Pick up zpack and take this if not better this is an antibiotic   These are over the counter medication options:  Mucinex dm green label for cough.  Sugar free cough drops Multivitamin  Quercetin 250-500 mg 2 times per day   Elderberry  Oil of oregano  cepacol or chloroseptic spray for sore throat  Warm salt water gargle for sore throat  Warm tea with honey and lemon  Hydration  Try to eat though you dont feel like it   Tylenol or Advil  Nasal saline and Flonase -2 sprays as needed over the counter  Claritin or zyrtec for runny nose or sneezing

## 2020-12-11 ENCOUNTER — Telehealth: Payer: Self-pay | Admitting: Internal Medicine

## 2020-12-11 NOTE — Telephone Encounter (Signed)
Patient called to give BP readings. 152/96 this morning, 89 pulse.

## 2020-12-14 ENCOUNTER — Telehealth: Payer: Self-pay

## 2020-12-14 NOTE — Telephone Encounter (Signed)
Lvm to Return in about 6 months (around 06/12/2021) for in person

## 2020-12-14 NOTE — Telephone Encounter (Signed)
Please advise 

## 2020-12-15 NOTE — Telephone Encounter (Signed)
Patient is taking amlodipine 10 mg daily. Patient is not taking the half of the combination pill. States that he waited 2 hours after medication to check his pressure. Patient did not check his blood pressure again after sending this message, no new blood pressure to report. Informed Patient that he will need to check blood pressure again to see if this is still high, unable to check at time of the call. Patient declines any symptoms such as headache, blurred vision, or dizziness.   Please advise

## 2020-12-15 NOTE — Telephone Encounter (Signed)
This is too high  Review BP list what is BP 2 hours after medication?

## 2021-01-03 NOTE — Telephone Encounter (Signed)
He should be on norvasc 10 mg daily for blood pressure and 1/2 losartan hctz 50-12.5 if BP still >130/>80 on these 2 medications he is to increased to 1 whole pill or losartan hctz 50-12.5 and continue to monitor Blood pressure  Make sure he has f/u when I return mid 03/2021, 11/22 for BP f/u

## 2021-01-04 NOTE — Telephone Encounter (Signed)
Patient states that he is taking the Norvasc 10 mg and half a pill of the Losartan HCTZ 50-12.5 daily. Patient states that his blood pressure has not been higher than 130/80 with taking the two medications. States his blood pressure has been in the 120s/70s.   Patient scheduled for a 6 month follow up 06/15/21. States he wanted to keep his appointment in January as suggested at his last visit.

## 2021-01-04 NOTE — Telephone Encounter (Signed)
Left message to return call 

## 2021-01-04 NOTE — Telephone Encounter (Signed)
Pt returned your call.  

## 2021-06-15 ENCOUNTER — Other Ambulatory Visit: Payer: Self-pay

## 2021-06-15 ENCOUNTER — Ambulatory Visit (INDEPENDENT_AMBULATORY_CARE_PROVIDER_SITE_OTHER): Payer: 59 | Admitting: Internal Medicine

## 2021-06-15 ENCOUNTER — Encounter: Payer: Self-pay | Admitting: Internal Medicine

## 2021-06-15 VITALS — BP 160/80 | HR 73 | Temp 98.0°F | Ht 74.02 in | Wt 200.8 lb

## 2021-06-15 DIAGNOSIS — Z0000 Encounter for general adult medical examination without abnormal findings: Secondary | ICD-10-CM

## 2021-06-15 DIAGNOSIS — I152 Hypertension secondary to endocrine disorders: Secondary | ICD-10-CM | POA: Diagnosis not present

## 2021-06-15 DIAGNOSIS — I10 Essential (primary) hypertension: Secondary | ICD-10-CM | POA: Diagnosis not present

## 2021-06-15 DIAGNOSIS — Z1329 Encounter for screening for other suspected endocrine disorder: Secondary | ICD-10-CM | POA: Diagnosis not present

## 2021-06-15 DIAGNOSIS — E1159 Type 2 diabetes mellitus with other circulatory complications: Secondary | ICD-10-CM

## 2021-06-15 DIAGNOSIS — Z125 Encounter for screening for malignant neoplasm of prostate: Secondary | ICD-10-CM | POA: Diagnosis not present

## 2021-06-15 DIAGNOSIS — Z0100 Encounter for examination of eyes and vision without abnormal findings: Secondary | ICD-10-CM

## 2021-06-15 DIAGNOSIS — E119 Type 2 diabetes mellitus without complications: Secondary | ICD-10-CM

## 2021-06-15 DIAGNOSIS — Z23 Encounter for immunization: Secondary | ICD-10-CM | POA: Diagnosis not present

## 2021-06-15 DIAGNOSIS — Z122 Encounter for screening for malignant neoplasm of respiratory organs: Secondary | ICD-10-CM

## 2021-06-15 LAB — CBC WITH DIFFERENTIAL/PLATELET
Basophils Absolute: 0 10*3/uL (ref 0.0–0.1)
Basophils Relative: 0.4 % (ref 0.0–3.0)
Eosinophils Absolute: 0.1 10*3/uL (ref 0.0–0.7)
Eosinophils Relative: 1.7 % (ref 0.0–5.0)
HCT: 42.7 % (ref 39.0–52.0)
Hemoglobin: 13.9 g/dL (ref 13.0–17.0)
Lymphocytes Relative: 19.9 % (ref 12.0–46.0)
Lymphs Abs: 1.3 10*3/uL (ref 0.7–4.0)
MCHC: 32.5 g/dL (ref 30.0–36.0)
MCV: 82.8 fl (ref 78.0–100.0)
Monocytes Absolute: 0.5 10*3/uL (ref 0.1–1.0)
Monocytes Relative: 7.7 % (ref 3.0–12.0)
Neutro Abs: 4.5 10*3/uL (ref 1.4–7.7)
Neutrophils Relative %: 70.3 % (ref 43.0–77.0)
Platelets: 186 10*3/uL (ref 150.0–400.0)
RBC: 5.15 Mil/uL (ref 4.22–5.81)
RDW: 14.5 % (ref 11.5–15.5)
WBC: 6.4 10*3/uL (ref 4.0–10.5)

## 2021-06-15 LAB — COMPREHENSIVE METABOLIC PANEL
ALT: 18 U/L (ref 0–53)
AST: 18 U/L (ref 0–37)
Albumin: 4.5 g/dL (ref 3.5–5.2)
Alkaline Phosphatase: 92 U/L (ref 39–117)
BUN: 11 mg/dL (ref 6–23)
CO2: 25 mEq/L (ref 19–32)
Calcium: 9.6 mg/dL (ref 8.4–10.5)
Chloride: 105 mEq/L (ref 96–112)
Creatinine, Ser: 1.03 mg/dL (ref 0.40–1.50)
GFR: 77.95 mL/min (ref >60.00)
Glucose, Bld: 105 mg/dL — ABNORMAL HIGH (ref 70–99)
Potassium: 4 mEq/L (ref 3.5–5.1)
Sodium: 138 mEq/L (ref 135–145)
Total Bilirubin: 0.4 mg/dL (ref 0.2–1.2)
Total Protein: 7.3 g/dL (ref 6.0–8.3)

## 2021-06-15 LAB — LIPID PANEL
Cholesterol: 108 mg/dL (ref 0–200)
HDL: 47.8 mg/dL (ref >39.00)
LDL Cholesterol: 44 mg/dL (ref 0–99)
NonHDL: 60.13
Total CHOL/HDL Ratio: 2
Triglycerides: 80 mg/dL (ref 0.0–149.0)
VLDL: 16 mg/dL (ref 0.0–40.0)

## 2021-06-15 LAB — PSA: PSA: 0.43 ng/mL (ref 0.10–4.00)

## 2021-06-15 LAB — TSH: TSH: 1.16 u[IU]/mL (ref 0.35–5.50)

## 2021-06-15 LAB — HEMOGLOBIN A1C: Hgb A1c MFr Bld: 6.7 % — ABNORMAL HIGH (ref 4.6–6.5)

## 2021-06-15 NOTE — Patient Instructions (Addendum)
Make sure taking norvasc/amlodipine 10 mg daily and hyzaar 1/2 pill let me know if this is what you are doing?  Dr. Wallace Going   Phone Fax E-mail Address  [phone] Not available [address]    [place] [address]    Consider prevnar 13  Pneumococcal Conjugate Vaccine (Prevnar 13) Suspension for Injection What is this medication? PNEUMOCOCCAL VACCINE (NEU mo KOK al vak SEEN) is a vaccine used to prevent pneumococcus bacterial infections. These bacteria can cause serious infections like pneumonia, meningitis, and blood infections. This vaccine will lower your chance of getting pneumonia. If you do get pneumonia, it can make your symptoms milder and your illness shorter. This vaccine will not treat an infection and will not cause infection. This vaccine is recommended for infants and young children, adults with certain medical conditions, and adults 75 years or older. This medicine may be used for other purposes; ask your health care provider or pharmacist if you have questions. COMMON BRAND NAME(S): Prevnar, Prevnar 13 What should I tell my care team before I take this medication? They need to know if you have any of these conditions: bleeding problems fever immune system problems an unusual or allergic reaction to pneumococcal vaccine, diphtheria toxoid, other vaccines, latex, other medicines, foods, dyes, or preservatives pregnant or trying to get pregnant breast-feeding How should I use this medication? This vaccine is for injection into a muscle. It is given by a health care professional. A copy of Vaccine Information Statements will be given before each vaccination. Read this sheet carefully each time. The sheet may change frequently. Talk to your pediatrician regarding the use of this medicine in children. While this drug may be prescribed for children as young as 30 weeks old for selected conditions, precautions do apply. Overdosage: If you think you have  taken too much of this medicine contact a poison control center or emergency room at once. NOTE: This medicine is only for you. Do not share this medicine with others. What if I miss a dose? It is important not to miss your dose. Call your doctor or health care professional if you are unable to keep an appointment. What may interact with this medication? medicines for cancer chemotherapy medicines that suppress your immune function steroid medicines like prednisone or cortisone This list may not describe all possible interactions. Give your health care provider a list of all the medicines, herbs, non-prescription drugs, or dietary supplements you use. Also tell them if you smoke, drink alcohol, or use illegal drugs. Some items may interact with your medicine. What should I watch for while using this medication? Mild fever and pain should go away in 3 days or less. Report any unusual symptoms to your doctor or health care professional. What side effects may I notice from receiving this medication? Side effects that you should report to your doctor or health care professional as soon as possible: allergic reactions like skin rash, itching or hives, swelling of the face, lips, or tongue breathing problems confused fast or irregular heartbeat fever over 102 degrees F seizures unusual bleeding or bruising unusual muscle weakness Side effects that usually do not require medical attention (report to your doctor or health care professional if they continue or are bothersome): aches and pains diarrhea fever of 102 degrees F or less headache irritable loss of appetite pain, tender at site where injected trouble sleeping This list may not describe all possible side effects. Call your doctor for medical advice about side effects. You may  report side effects to FDA at 1-800-FDA-1088. Where should I keep my medication? This does not apply. This vaccine is given in a clinic, pharmacy, doctor's  office, or other health care setting and will not be stored at home. NOTE: This sheet is a summary. It may not cover all possible information. If you have questions about this medicine, talk to your doctor, pharmacist, or health care provider.  2022 Elsevier/Gold Standard (2014-02-27 00:00:00)

## 2021-06-15 NOTE — Progress Notes (Signed)
Chief Complaint  Patient presents with   Follow-up   Annual 1. Htn uncontrolled norvasc 10 mg qd hyzaar 50-12.5 1/2 pill not sure which pill he is taking or if taking both will my chart tomorrow    Review of Systems  Constitutional:  Negative for weight loss.  HENT:  Negative for hearing loss.   Eyes:  Negative for blurred vision.  Respiratory:  Negative for shortness of breath.   Cardiovascular:  Negative for chest pain.  Gastrointestinal:  Negative for abdominal pain and blood in stool.  Musculoskeletal:  Negative for back pain.  Skin:  Negative for rash.  Neurological:  Negative for headaches.  Psychiatric/Behavioral:  Negative for depression.   Past Medical History:  Diagnosis Date   GERD (gastroesophageal reflux disease)    Hyperlipidemia    Hypertension    Low back pain    Past Surgical History:  Procedure Laterality Date   COLONOSCOPY WITH PROPOFOL N/A 01/31/2017   Procedure: COLONOSCOPY WITH PROPOFOL;  Surgeon: Lucilla Lame, MD;  Location: [facility] ENDOSCOPY;  Service: Endoscopy;  Laterality: N/A;   Family History  Problem Relation Age of Onset   Hypertension Mother    Heart attack Mother    Hypertension Father    Cancer Father        prostate    Social History   Socioeconomic History   Marital status: Married    Spouse name: Not on file   Number of children: Not on file   Years of education: Not on file   Highest education level: Not on file  Occupational History   Not on file  Tobacco Use   Smoking status: Every Day    Packs/day: 1.00    Years: 30.00    Pack years: 30.00    Types: Cigarettes   Smokeless tobacco: Never   Tobacco comments:    only smokes about a half pack a day now  Vaping Use   Vaping Use: Never used  Substance and Sexual Activity   Alcohol use: Yes    Comment: socially   Drug use: No   Sexual activity: Not on file  Other Topics Concern   Not on file  Social History Narrative   From [place]    Married 27 years as of 03/2019     9 kids 5 girls and 4 boys    Employed    Social Determinants of Radio broadcast assistant Strain: Not on file  Food Insecurity: Not on file  Transportation Needs: Not on file  Physical Activity: Not on file  Stress: Not on file  Social Connections: Not on file  Intimate Partner Violence: Not on file   Current Meds  Medication Sig   amLODipine (NORVASC) 10 MG tablet Take 1 tablet (10 mg total) by mouth daily.   ketoconazole (NIZORAL) 2 % cream Apply 1 application topically 2 (two) times daily as needed for irritation.   losartan-hydrochlorothiazide (HYZAAR) 50-12.5 MG tablet Take 0.5 tablets by mouth daily. In am if BP >130/>80 after 3 days start 1 pill daily in the am. D/c hctz 12.5 alone   metFORMIN (GLUCOPHAGE) 500 MG tablet Take 1 tablet (500 mg total) by mouth daily with breakfast.   omeprazole (PRILOSEC) 40 MG capsule Take 1 capsule (40 mg total) by mouth daily. 30 minutes before food   rosuvastatin (CRESTOR) 20 MG tablet Take 1 tablet (20 mg total) by mouth at bedtime.   Allergies  Allergen Reactions   Lisinopril     Cough  No results found for this or any previous visit (from the past 2160 hour(s)). Objective  Body mass index is 25.77 kg/m. Wt Readings from Last 3 Encounters:  06/15/21 200 lb 12.8 oz (91.1 kg)  12/10/20 198 lb (89.8 kg)  09/09/20 196 lb (88.9 kg)   Temp Readings from Last 3 Encounters:  06/15/21 98 F (36.7 C) (Temporal)  09/09/20 98.3 F (36.8 C) (Oral)  03/11/20 98.4 F (36.9 C)   BP Readings from Last 3 Encounters:  06/15/21 (!) 160/80  09/09/20 138/80  03/25/20 135/82   Pulse Readings from Last 3 Encounters:  06/15/21 73  09/09/20 74  03/25/20 74    Physical Exam Vitals and nursing note reviewed.  Constitutional:      Appearance: Normal appearance. He is well-developed and well-groomed. He is obese.  HENT:     Head: Normocephalic and atraumatic.  Eyes:     Conjunctiva/sclera: Conjunctivae normal.     Pupils: Pupils are  equal, round, and reactive to light.  Cardiovascular:     Rate and Rhythm: Normal rate and regular rhythm.     Heart sounds: Normal heart sounds.  Pulmonary:     Effort: Pulmonary effort is normal. No respiratory distress.     Breath sounds: Normal breath sounds.  Abdominal:     Tenderness: There is no abdominal tenderness.  Skin:    General: Skin is warm and moist.  Neurological:     General: No focal deficit present.     Mental Status: He is alert and oriented to person, place, and time. Mental status is at baseline.     Sensory: Sensation is intact.     Motor: Motor function is intact.     Coordination: Coordination is intact.     Gait: Gait is intact. Gait normal.  Psychiatric:        Attention and Perception: Attention and perception normal.        Mood and Affect: Mood and affect normal.        Speech: Speech normal.        Behavior: Behavior normal. Behavior is cooperative.        Thought Content: Thought content normal.        Cognition and Memory: Cognition and memory normal.        Judgment: Judgment normal.    Assessment  Plan  Annual physical exam See below Needs flu shot - Plan: Flu Vaccine QUAD 6+ mos PF IM (Fluarix Quad PF)  Hypertension, uncontrolled pt on norvasc 10 mg and hyzaar 50-12.5 1/2 pill not sure if taking both  Will my chart tomorrow   Hypertension associated with diabetes ([place]) - Plan: Comprehensive metabolic panel, Lipid panel, Hemoglobin A1c, CBC w/Diff, Ambulatory referral to Ophthalmology, Urinalysis, Routine w reflex microscopic, Microalbumin / creatinine urine ratio Diabetic eye exam ([place]) - Plan: Ambulatory referral to Ophthalmology Foot exam in future   Encounter for screening for lung cancer - Plan: Ambulatory Referral Lung Cancer Screening [facility]  Rec smoking cessation   Fasting labs today Flu shot utd  pna 23 vaccine utd Consider shingrix  moderna 4/4 consider booster Tdap had 2017 Consider prevnar vaccine   PSA  check FH prostate cancer father 0.46 03/11/20    Colonoscopy had 01/31/17 +polpys f/u in 5 years Dr. Allen Norris due 01/2022   Consider CT chest screening in future for lung cancer if at risk calc risk score in future low risk smoker 5-10 cig to < 1/2 lasts x 2.5 days no FH lung  cancer father had prostate cancer smoker since age 65/18 -rec smoking cessation  -as of 09/10/20 referred and today      rec centrum Silver mens MVT   rec healthy diet and exercise  Provider: Dr. Olivia Mackie McLean-Scocuzza-Internal Medicine

## 2021-06-16 ENCOUNTER — Telehealth: Payer: Self-pay | Admitting: Internal Medicine

## 2021-06-16 ENCOUNTER — Other Ambulatory Visit: Payer: Self-pay | Admitting: Internal Medicine

## 2021-06-16 DIAGNOSIS — I10 Essential (primary) hypertension: Secondary | ICD-10-CM

## 2021-06-16 LAB — URINALYSIS, ROUTINE W REFLEX MICROSCOPIC
Bilirubin Urine: NEGATIVE
Glucose, UA: NEGATIVE
Hgb urine dipstick: NEGATIVE
Ketones, ur: NEGATIVE
Leukocytes,Ua: NEGATIVE
Nitrite: NEGATIVE
Protein, ur: NEGATIVE
Specific Gravity, Urine: 1.012 (ref 1.001–1.035)
pH: 5.5 (ref 5.0–8.0)

## 2021-06-16 LAB — MICROALBUMIN / CREATININE URINE RATIO
Creatinine, Urine: 105 mg/dL (ref 20–320)
Microalb Creat Ratio: 10 mcg/mg creat (ref <30)
Microalb, Ur: 1 mg/dL

## 2021-06-16 MED ORDER — LOSARTAN POTASSIUM-HCTZ 50-12.5 MG PO TABS: 1.0000 | ORAL_TABLET | Freq: Every day | ORAL | 3 refills | Status: AC

## 2021-06-16 NOTE — Telephone Encounter (Signed)
sent 

## 2021-06-16 NOTE — Telephone Encounter (Signed)
Patient was seen yesterday and Dr Olivia Mackie wanted to know what bp medication he is not taking. He is not taking the losartan-hydrochlorothiazide (HYZAAR) 50-12.5 MG tablet and needs a refill on that medication.

## 2021-06-16 NOTE — Telephone Encounter (Signed)
Okay to fill? 

## 2021-06-17 ENCOUNTER — Telehealth: Payer: Self-pay

## 2021-06-17 NOTE — Telephone Encounter (Signed)
LMTCB for lab results.  

## 2021-06-17 NOTE — Telephone Encounter (Signed)
Pt returning call. Pt disconnect the line while on hold to transfer call

## 2021-06-17 NOTE — Telephone Encounter (Signed)
Anthony Harding, Anthony Harding  06/17/2021 11:46 AM EST Back to Top    I called and spoke with the patients wife and she stated the patient was only taking the Amlodipine and I informed her that the provider sent in the [place] on 06/16/2021 aand he is to take that along with the Amlodipine.  Nina,cma    Nino Glow McLean-Scocuzza, MD  06/17/2021 10:49 AM EST     What is pt taking he should be on hyzaar and amlodipine for blood pressure inform   Earlyne Iba, CMA  06/17/2021 10:22 AM EST     Spoke with patient & results reviewed. Pt stated that he is only taking the one tablet of Hyzaar daily. No amlodipine.    Earlyne Iba, CMA  06/17/2021  9:42 AM EST     LMTCB for lab results.    Nino Glow McLean-Scocuzza, MD  06/16/2021  9:44 AM EST     Urine normal Liver kidneys normal  Cholesterol normal A1c 6.7=diabetes rec healthy diet and exercise  What medications is he taking for blood pressure norvasc 10 mg? Hyzaar 1/2 or 1 pill daily?  Thyroid lab normal  Psa normal prostate cancer screening  Blood cts normal

## 2021-06-24 ENCOUNTER — Telehealth: Payer: Self-pay | Admitting: Internal Medicine

## 2021-06-24 NOTE — Telephone Encounter (Signed)
Rejection Reason - Patient did not respond" Anthony Harding said on Jun 24, 2021 9:13 AM  Msg from [place] ent

## 2021-08-05 ENCOUNTER — Other Ambulatory Visit: Payer: Self-pay

## 2021-08-05 DIAGNOSIS — F17210 Nicotine dependence, cigarettes, uncomplicated: Secondary | ICD-10-CM

## 2021-08-05 DIAGNOSIS — Z87891 Personal history of nicotine dependence: Secondary | ICD-10-CM

## 2021-08-17 ENCOUNTER — Ambulatory Visit (INDEPENDENT_AMBULATORY_CARE_PROVIDER_SITE_OTHER): Payer: 59 | Admitting: Acute Care

## 2021-08-17 ENCOUNTER — Other Ambulatory Visit: Payer: Self-pay

## 2021-08-17 ENCOUNTER — Encounter: Payer: Self-pay | Admitting: Acute Care

## 2021-08-17 DIAGNOSIS — F17210 Nicotine dependence, cigarettes, uncomplicated: Secondary | ICD-10-CM | POA: Diagnosis not present

## 2021-08-17 NOTE — Progress Notes (Signed)
Virtual Visit via Telephone Note ? ?I connected with Simonne Maffucci on 08/17/21 at  3:30 PM EDT by telephone and verified that I am speaking with the correct person using two identifiers. ? ?Location: ?Patient:  At home ?Provider:  [place], [place], [place], [address]  ?  ?I discussed the limitations, risks, security and privacy concerns of performing an evaluation and management service by telephone and the availability of in person appointments. I also discussed with the patient that there may be a patient responsible charge related to this service. The patient expressed understanding and agreed to proceed. ? ? ?Shared Decision Making Visit Lung Cancer Screening Program ?([phone]) ? ? ?Eligibility: ?Age 61 y.o. ?Pack Years Smoking History Calculation 23 pack year smoking history ?(# packs/per year x # years smoked) ?Recent History of coughing up blood  no ?Unexplained weight loss? no ?( >Than 15 pounds within the last 6 months ) ?Prior History Lung / other cancer no ?(Diagnosis within the last 5 years already requiring surveillance chest CT Scans). ?Smoking Status Current Smoker ?Former Smokers: Years since quit:  NA ? Quit Date:  NA ? ?Visit Components: ?Discussion included one or more decision making aids. yes ?Discussion included risk/benefits of screening. yes ?Discussion included potential follow up diagnostic testing for abnormal scans. yes ?Discussion included meaning and risk of over diagnosis. yes ?Discussion included meaning and risk of False Positives. yes ?Discussion included meaning of total radiation exposure. yes ? ?Counseling Included: ?Importance of adherence to annual lung cancer LDCT screening. yes ?Impact of comorbidities on ability to participate in the program. yes ?Ability and willingness to under diagnostic treatment. yes ? ?Smoking Cessation Counseling: ?Current Smokers:  ?Discussed importance of smoking cessation. yes ?Information about tobacco cessation classes and  interventions provided to patient. yes ?Patient provided with "ticket" for LDCT Scan. yes ?Symptomatic Patient. no ? Counseling NA ?Diagnosis Code: Tobacco Use Z72.0 ?Asymptomatic Patient yes ? Counseling (Intermediate counseling: > three minutes counseling) W4315 ?Former Smokers:  ?Discussed the importance of maintaining cigarette abstinence. yes ?Diagnosis Code: Personal History of Nicotine Dependence. Q00.867 ?Information about tobacco cessation classes and interventions provided to patient. Yes ?Patient provided with "ticket" for LDCT Scan. yes ?Written Order for Lung Cancer Screening with LDCT placed in Epic. Yes ?(CT Chest Lung Cancer Screening Low Dose W/O CM) [id number] ?Z12.2-Screening of respiratory organs ?Z87.891-Personal history of nicotine dependence ? ?I have spent 25 minutes of face to face/ virtual visit   time with  Mr. Bostelman discussing the risks and benefits of lung cancer screening. We viewed / discussed a power point together that explained in detail the above noted topics. We paused at intervals to allow for questions to be asked and answered to ensure understanding.We discussed that the single most powerful action that he can take to decrease his risk of developing lung cancer is to quit smoking. We discussed whether or not he is ready to commit to setting a quit date. We discussed options for tools to aid in quitting smoking including nicotine replacement therapy, non-nicotine medications, support groups, Quit Smart classes, and behavior modification. We discussed that often times setting smaller, more achievable goals, such as eliminating 1 cigarette a day for a week and then 2 cigarettes a day for a week can be helpful in slowly decreasing the number of cigarettes smoked. This allows for a sense of accomplishment as well as providing a clinical benefit. I provided  him  with smoking cessation  information  with contact information for community resources,  classes, free nicotine replacement  therapy, and access to mobile apps, text messaging, and on-line smoking cessation help. I have also provided  him  the office contact information in the event he needs to contact me, or the screening staff. We discussed the time and location of the scan, and that either Doroteo Glassman RN, Joella Prince, RN  or I will call / send a letter with the results within 24-72 hours of receiving them. The patient verbalized understanding of all of  the above and had no further questions upon leaving the office. They have my contact information in the event they have any further questions. ? ?I spent 3 minutes counseling on smoking cessation and the health risks of continued tobacco abuse. ? ?I explained to the patient that there has been a high incidence of coronary artery disease noted on these exams. I explained that this is a non-gated exam therefore degree or severity cannot be determined. This patient is on statin therapy. I have asked the patient to follow-up with their PCP regarding any incidental finding of coronary artery disease and management with diet or medication as their PCP  feels is clinically indicated. The patient verbalized understanding of the above and had no further questions upon completion of the visit. ? ?  ? ? ?Magdalen Spatz, NP ?08/17/2021 ? ? ? ? ? ? ?

## 2021-08-17 NOTE — Patient Instructions (Signed)
Thank you for participating in the [facility] Cancer Screening [facility]. °It was our pleasure to meet you today. °We will call you with the results of your scan within the next few days. °Your scan will be assigned a Lung RADS category score by the physicians reading the scans.  °This Lung RADS score determines follow up scanning.  °See below for description of categories, and follow up screening recommendations. °We will be in touch to schedule your follow up screening annually or based on recommendations of our providers. °We will fax a copy of your scan results to your Primary Care Physician, or the physician who referred you to the program, to ensure they have the results. °Please call the office if you have any questions or concerns regarding your scanning experience or results.  °Our office number is [phone]. °Please speak with Denise Phelps, RN. She is our Lung Cancer Screening RN. °If she is unavailable when you call, please have the office staff send her a message. She will return your call at her earliest convenience. °Remember, if your scan is normal, we will scan you annually as long as you continue to meet the criteria for the program. (Age 55-77, Current smoker or smoker who has quit within the last 15 years). °If you are a smoker, remember, quitting is the single most powerful action that you can take to decrease your risk of lung cancer and other pulmonary, breathing related problems. °We know quitting is hard, and we are here to help.  °Please let us know if there is anything we can do to help you meet your goal of quitting. °If you are a former smoker, congratulations. We are proud of you! Remain smoke free! °Remember you can refer friends or family members through the number above.  °We will screen them to make sure they meet criteria for the program. °Thank you for helping us take better care of you by participating in Lung Screening. ° °You can receive free nicotine replacement therapy  ( patches, gum or mints) by calling 1-800-QUIT NOW. Please call so we can get you on the path to becoming  a non-smoker. I know it is hard, but you can do this! ° °Lung RADS Categories: ° °Lung RADS 1: no nodules or definitely non-concerning nodules.  °Recommendation is for a repeat annual scan in 12 months. ° °Lung RADS 2:  nodules that are non-concerning in appearance and behavior with a very low likelihood of becoming an active cancer. °Recommendation is for a repeat annual scan in 12 months. ° °Lung RADS 3: nodules that are probably non-concerning , includes nodules with a low likelihood of becoming an active cancer.  Recommendation is for a 6-month repeat screening scan. Often noted after an upper respiratory illness. We will be in touch to make sure you have no questions, and to schedule your 6-month scan. ° °Lung RADS 4 A: nodules with concerning findings, recommendation is most often for a follow up scan in 3 months or additional testing based on our provider's assessment of the scan. We will be in touch to make sure you have no questions and to schedule the recommended 3 month follow up scan. ° °Lung RADS 4 B:  indicates findings that are concerning. We will be in touch with you to schedule additional diagnostic testing based on our provider's  assessment of the scan. ° °Hypnosis for smoking cessation  °[employer] °[phone] ° °Acupuncture for smoking cessation  °[facility] °[phone]  °

## 2021-08-18 ENCOUNTER — Other Ambulatory Visit: Payer: Self-pay

## 2021-08-18 ENCOUNTER — Ambulatory Visit
Admission: RE | Admit: 2021-08-18 | Discharge: 2021-08-18 | Disposition: A | Discharge status: Home / Self Care | Payer: 59 | Source: Ambulatory Visit | Attending: Acute Care | Admitting: Acute Care

## 2021-08-18 DIAGNOSIS — F17210 Nicotine dependence, cigarettes, uncomplicated: Secondary | ICD-10-CM | POA: Insufficient documentation

## 2021-08-18 DIAGNOSIS — Z87891 Personal history of nicotine dependence: Secondary | ICD-10-CM | POA: Diagnosis not present

## 2021-08-19 ENCOUNTER — Encounter: Payer: Self-pay | Admitting: Internal Medicine

## 2021-08-19 DIAGNOSIS — R911 Solitary pulmonary nodule: Secondary | ICD-10-CM | POA: Insufficient documentation

## 2021-08-19 DIAGNOSIS — J439 Emphysema, unspecified: Secondary | ICD-10-CM | POA: Insufficient documentation

## 2021-08-20 ENCOUNTER — Other Ambulatory Visit: Payer: Self-pay

## 2021-08-20 DIAGNOSIS — Z87891 Personal history of nicotine dependence: Secondary | ICD-10-CM

## 2021-08-20 DIAGNOSIS — F17210 Nicotine dependence, cigarettes, uncomplicated: Secondary | ICD-10-CM

## 2021-10-06 ENCOUNTER — Other Ambulatory Visit: Payer: Self-pay | Admitting: Internal Medicine

## 2021-10-06 ENCOUNTER — Telehealth: Payer: Self-pay | Admitting: Internal Medicine

## 2021-10-06 DIAGNOSIS — E785 Hyperlipidemia, unspecified: Secondary | ICD-10-CM

## 2021-10-06 DIAGNOSIS — E119 Type 2 diabetes mellitus without complications: Secondary | ICD-10-CM

## 2021-10-06 DIAGNOSIS — I10 Essential (primary) hypertension: Secondary | ICD-10-CM

## 2021-10-06 DIAGNOSIS — K219 Gastro-esophageal reflux disease without esophagitis: Secondary | ICD-10-CM

## 2021-10-06 MED ORDER — ROSUVASTATIN CALCIUM 20 MG PO TABS: 20.0000 mg | ORAL_TABLET | Freq: Every day | ORAL | 3 refills | Status: DC

## 2021-10-06 MED ORDER — OMEPRAZOLE 40 MG PO CPDR: 40.0000 mg | DELAYED_RELEASE_CAPSULE | Freq: Every day | ORAL | 3 refills | Status: AC

## 2021-10-06 MED ORDER — AMLODIPINE BESYLATE 10 MG PO TABS: 10.0000 mg | ORAL_TABLET | Freq: Every day | ORAL | 3 refills | Status: DC

## 2021-10-06 MED ORDER — METFORMIN HCL 500 MG PO TABS: 500.0000 mg | ORAL_TABLET | Freq: Every day | ORAL | 3 refills | Status: DC

## 2021-10-06 NOTE — Telephone Encounter (Signed)
Pt spouse called stating pt need refill on amLODipine and rosuvastatin sent to [facility] in [place] ?

## 2021-10-20 ENCOUNTER — Encounter: Payer: Self-pay | Admitting: Internal Medicine

## 2021-10-20 ENCOUNTER — Ambulatory Visit (INDEPENDENT_AMBULATORY_CARE_PROVIDER_SITE_OTHER): Payer: 59 | Admitting: Internal Medicine

## 2021-10-20 VITALS — BP 130/80 | HR 93 | Temp 99.2°F | Resp 14 | Ht 74.02 in | Wt 198.0 lb

## 2021-10-20 DIAGNOSIS — F17210 Nicotine dependence, cigarettes, uncomplicated: Secondary | ICD-10-CM | POA: Diagnosis not present

## 2021-10-20 DIAGNOSIS — N529 Male erectile dysfunction, unspecified: Secondary | ICD-10-CM | POA: Diagnosis not present

## 2021-10-20 DIAGNOSIS — E119 Type 2 diabetes mellitus without complications: Secondary | ICD-10-CM

## 2021-10-20 DIAGNOSIS — E1159 Type 2 diabetes mellitus with other circulatory complications: Secondary | ICD-10-CM

## 2021-10-20 DIAGNOSIS — E559 Vitamin D deficiency, unspecified: Secondary | ICD-10-CM

## 2021-10-20 DIAGNOSIS — I10 Essential (primary) hypertension: Secondary | ICD-10-CM

## 2021-10-20 DIAGNOSIS — H269 Unspecified cataract: Secondary | ICD-10-CM

## 2021-10-20 DIAGNOSIS — Z1211 Encounter for screening for malignant neoplasm of colon: Secondary | ICD-10-CM

## 2021-10-20 DIAGNOSIS — H6121 Impacted cerumen, right ear: Secondary | ICD-10-CM

## 2021-10-20 DIAGNOSIS — I152 Hypertension secondary to endocrine disorders: Secondary | ICD-10-CM

## 2021-10-20 MED ORDER — SILDENAFIL CITRATE 20 MG PO TABS: 20.0000 mg | ORAL_TABLET | Freq: Every day | ORAL | 5 refills | Status: AC | PRN

## 2021-10-20 MED ORDER — DEBROX 6.5 % OT SOLN: 10.0000 [drp] | Freq: Two times a day (BID) | OTIC | 0 refills | Status: DC

## 2021-10-20 MED ORDER — NICOTINE 21 MG/24HR TD PT24: 21.0000 mg | MEDICATED_PATCH | Freq: Every day | TRANSDERMAL | 0 refills | Status: DC

## 2021-10-20 MED ORDER — NICOTINE 7 MG/24HR TD PT24: 7.0000 mg | MEDICATED_PATCH | Freq: Every day | TRANSDERMAL | 0 refills | Status: DC

## 2021-10-20 MED ORDER — NICOTINE 14 MG/24HR TD PT24: 14.0000 mg | MEDICATED_PATCH | Freq: Every day | TRANSDERMAL | 0 refills | Status: DC

## 2021-10-20 NOTE — Progress Notes (Signed)
Chief Complaint  ?Patient presents with  ? Follow-up  ?  4 mon, denies any concerns or pain.   ? ?F/u  ?Htn on norvasc 10 and losartan hctz 50-12.5 mg qd bp improved taking both  ?Eye exam with dm 2 and cataracta needs to f/u [facility] not heard about appt  ?Smoking 1 pk will last 2-2.5 days wants to quit and cut back wants to try nicotine patches  ?ED wants medication for this ? ?Review of Systems  ?Constitutional:  Negative for weight loss.  ?HENT:  Negative for hearing loss.   ?Eyes:  Negative for blurred vision.  ?Respiratory:  Negative for shortness of breath.   ?Cardiovascular:  Negative for chest pain.  ?Gastrointestinal:  Negative for abdominal pain and blood in stool.  ?Musculoskeletal:  Negative for back pain.  ?Skin:  Negative for rash.  ?Neurological:  Negative for headaches.  ?Psychiatric/Behavioral:  Negative for depression.   ?Past Medical History:  ?Diagnosis Date  ? GERD (gastroesophageal reflux disease)   ? Hyperlipidemia   ? Hypertension   ? Low back pain   ? ?Past Surgical History:  ?Procedure Laterality Date  ? COLONOSCOPY WITH PROPOFOL N/A 01/31/2017  ? Procedure: COLONOSCOPY WITH PROPOFOL;  Surgeon: Lucilla Lame, MD;  Location: [facility] ENDOSCOPY;  Service: Endoscopy;  Laterality: N/A;  ? ?Family History  ?Problem Relation Age of Onset  ? Hypertension Mother   ? Heart attack Mother   ? Hypertension Father   ? Cancer Father   ?     prostate   ? ?Social History  ? ?Socioeconomic History  ? Marital status: Married  ?  Spouse name: Not on file  ? Number of children: Not on file  ? Years of education: Not on file  ? Highest education level: Not on file  ?Occupational History  ? Not on file  ?Tobacco Use  ? Smoking status: Every Day  ?  Packs/day: 1.00  ?  Years: 30.00  ?  Pack years: 30.00  ?  Types: Cigarettes  ? Smokeless tobacco: Never  ? Tobacco comments:  ?  only smokes about a half pack a day now  ?Vaping Use  ? Vaping Use: Never used  ?Substance and Sexual Activity  ? Alcohol use: Yes  ?   Comment: socially  ? Drug use: No  ? Sexual activity: Not on file  ?Other Topics Concern  ? Not on file  ?Social History Narrative  ? From [place]   ? Married 27 years as of 03/2019   ? 9 kids 5 girls and 4 boys   ? Employed   ? ?Social Determinants of Health  ? ?Financial Resource Strain: Not on file  ?Food Insecurity: Not on file  ?Transportation Needs: Not on file  ?Physical Activity: Not on file  ?Stress: Not on file  ?Social Connections: Not on file  ?Intimate Partner Violence: Not on file  ? ?Current Meds  ?Medication Sig  ? amLODipine (NORVASC) 10 MG tablet Take 1 tablet (10 mg total) by mouth daily.  ? carbamide peroxide (DEBROX) 6.5 % OTIC solution Place 10 drops into the right ear 2 (two) times daily. To right ear  ? ketoconazole (NIZORAL) 2 % cream Apply 1 application topically 2 (two) times daily as needed for irritation.  ? losartan-hydrochlorothiazide (HYZAAR) 50-12.5 MG tablet Take 1 tablet by mouth daily. D/c hctz 12.5 alone  ? metFORMIN (GLUCOPHAGE) 500 MG tablet Take 1 tablet (500 mg total) by mouth daily with breakfast.  ? nicotine (NICODERM CQ -  DOSED IN MG/24 HOURS) 14 mg/24hr patch Place 1 patch (14 mg total) onto the skin daily.  ? nicotine (NICODERM CQ - DOSED IN MG/24 HOURS) 21 mg/24hr patch Place 1 patch (21 mg total) onto the skin daily. Then 14 mg qd x 30 days x 7 mg x daily x 30 days  ? nicotine (NICODERM CQ - DOSED IN MG/24 HR) 7 mg/24hr patch Place 1 patch (7 mg total) onto the skin daily.  ? omeprazole (PRILOSEC) 40 MG capsule Take 1 capsule (40 mg total) by mouth daily. 30 minutes before food  ? rosuvastatin (CRESTOR) 20 MG tablet Take 1 tablet (20 mg total) by mouth at bedtime.  ? sildenafil (REVATIO) 20 MG tablet Take 1-5 tablets (20-100 mg total) by mouth daily as needed. 30 minutes to 4 hours as needed before sex  ? ?Allergies  ?Allergen Reactions  ? Lisinopril   ?  Cough ?  ? ?No results found for this or any previous visit (from the past 2160 hour(s)). ?Objective  ?Body mass  index is 25.41 kg/m?. ?Wt Readings from Last 3 Encounters:  ?10/20/21 198 lb (89.8 kg)  ?06/15/21 200 lb 12.8 oz (91.1 kg)  ?12/10/20 198 lb (89.8 kg)  ? ?Temp Readings from Last 3 Encounters:  ?10/20/21 99.2 ?F (37.3 ?C) (Oral)  ?06/15/21 98 ?F (36.7 ?C) (Temporal)  ?09/09/20 98.3 ?F (36.8 ?C) (Oral)  ? ?BP Readings from Last 3 Encounters:  ?10/20/21 130/80  ?06/15/21 (!) 160/80  ?09/09/20 138/80  ? ?Pulse Readings from Last 3 Encounters:  ?10/20/21 93  ?06/15/21 73  ?09/09/20 74  ? ? ?Physical Exam ?Vitals and nursing note reviewed.  ?Constitutional:   ?   Appearance: Normal appearance. He is well-developed and well-groomed.  ?HENT:  ?   Head: Normocephalic and atraumatic.  ?   Right Ear: There is impacted cerumen.  ?Eyes:  ?   Conjunctiva/sclera: Conjunctivae normal.  ?   Pupils: Pupils are equal, round, and reactive to light.  ?Cardiovascular:  ?   Rate and Rhythm: Normal rate and regular rhythm.  ?   Heart sounds: Normal heart sounds.  ?Pulmonary:  ?   Effort: Pulmonary effort is normal. No respiratory distress.  ?   Breath sounds: Normal breath sounds.  ?Abdominal:  ?   Tenderness: There is no abdominal tenderness.  ?Skin: ?   General: Skin is warm and moist.  ?Neurological:  ?   General: No focal deficit present.  ?   Mental Status: He is alert and oriented to person, place, and time. Mental status is at baseline.  ?   Sensory: Sensation is intact.  ?   Motor: Motor function is intact.  ?   Coordination: Coordination is intact.  ?   Gait: Gait is intact. Gait normal.  ?Psychiatric:     ?   Attention and Perception: Attention and perception normal.     ?   Mood and Affect: Mood and affect normal.     ?   Speech: Speech normal.     ?   Behavior: Behavior normal. Behavior is cooperative.     ?   Thought Content: Thought content normal.     ?   Cognition and Memory: Cognition and memory normal.     ?   Judgment: Judgment normal.  ? ? ?Assessment  ?Plan  ?Hypertension associated with diabetes ([place]) - Plan:  Comprehensive metabolic panel, Lipid panel, Hemoglobin A1c, CBC w/Diff ?Bp improved on norvasc 10 mg and hyzaar 50-12.5 mg qd  ? ? ?  Cigarette nicotine dependence without complication - Plan: nicotine (NICODERM CQ - DOSED IN MG/24 HOURS) 21 mg/24hr patch, nicotine (NICODERM CQ - DOSED IN MG/24 HOURS) 14 mg/24hr patch, nicotine (NICODERM CQ - DOSED IN MG/24 HR) 7 mg/24hr patch ?Call 1800 quit now ? ? ?Erectile dysfunction, unspecified erectile dysfunction type Rx sildenafil 20-100 mg qd prn ? ?Diabetic eye exam [facility]) - Plan: Ambulatory referral to Ophthalmology [facility] ? ? ?Cataract, unspecified cataract type, unspecified laterality - Plan: Ambulatory referral to Ophthalmology ? ? ?Impacted cerumen of right ear - Plan: carbamide peroxide (DEBROX) 6.5 % OTIC solution 5-10 drops right ear x 7 days ? ?Vitamin D deficiency - Plan: Vitamin D (25 hydroxy)  ? ?HM ?Flu shot utd  ?pna 23 vaccine utd ?Consider shingrix  ?moderna 4/4 consider booster ?Tdap had 2017 ?Consider prevnar 20 vaccine in the future ?  ?PSA check FH prostate cancer father ?0.43 06/15/21  ?  ?Colonoscopy had 01/31/17 +polpys f/u in 5 years ?Dr. Allen Norris due 01/2022 ordered 10/20/21 ?  ?CT chest screening for lung cancer  ?08/18/21 ?IMPRESSION: ?1. Lung-RADS 2, benign appearance or behavior. Continue annual ?screening with low-dose chest CT without contrast in 12 months. ?2.  Emphysema. (ICD10-J43.9) ?  ?  ?rec centrum Silver mens MVT  ?rec healthy diet and exercise  ?Provider: Dr. Olivia Mackie McLean-Scocuzza-Internal Medicine  ?

## 2021-10-20 NOTE — Patient Instructions (Addendum)
Dr. Allen Norris Colonoscopy due 01/31/22 they should call you but if you dont hear call them ? ?Phone Fax E-mail Address  ?[phone] Not available [address]  ? [place]  [place] [address]  ?   ?Specialties     ?Gastroenterology     ? ?[place] to quit cigarettes ? ? ? ?Dr. Wallace Going  ? ? ?Phone Fax E-mail Address  ?[phone] Not available [address]   ? [place] [address]  ?   ?Specialties     ?Ophthalmology     ? ? ?Diabetes Mellitus and Nutrition, Adult ?When you have diabetes, or diabetes mellitus, it is very important to have healthy eating habits because your blood sugar (glucose) levels are greatly affected by what you eat and drink. Eating healthy foods in the right amounts, at about the same times every day, can help you: ?Manage your blood glucose. ?Lower your risk of heart disease. ?Improve your blood pressure. ?Reach or maintain a healthy weight. ?What can affect my meal plan? ?Every person with diabetes is different, and each person has different needs for a meal plan. Your health care provider may recommend that you work with a dietitian to make a meal plan that is best for you. Your meal plan may vary depending on factors such as: ?The calories you need. ?The medicines you take. ?Your weight. ?Your blood glucose, blood pressure, and cholesterol levels. ?Your activity level. ?Other health conditions you have, such as heart or kidney disease. ?How do carbohydrates affect me? ?Carbohydrates, also called carbs, affect your blood glucose level more than any other type of food. Eating carbs raises the amount of glucose in your blood. ?It is important to know how many carbs you can safely have in each meal. This is different for every person. Your dietitian can help you calculate how many carbs you should have at each meal and for each snack. ?How does alcohol affect me? ?Alcohol can cause a decrease in blood glucose (hypoglycemia), especially if you use insulin or  take certain diabetes medicines by mouth. Hypoglycemia can be a life-threatening condition. Symptoms of hypoglycemia, such as sleepiness, dizziness, and confusion, are similar to symptoms of having too much alcohol. ?Do not drink alcohol if: ?Your health care provider tells you not to drink. ?You are pregnant, may be pregnant, or are planning to become pregnant. ?If you drink alcohol: ?Limit how much you have to: ?0-1 drink a day for women. ?0-2 drinks a day for men. ?Know how much alcohol is in your drink. In the [place] one drink equals one 12 oz bottle of beer (355 mL), one 5 oz glass of wine (148 mL), or one 1? oz glass of hard liquor (44 mL). ?Keep yourself hydrated with water, diet soda, or unsweetened iced tea. Keep in mind that regular soda, juice, and other mixers may contain a lot of sugar and must be counted as carbs. ?What are tips for following this plan? ? ?Reading food labels ?Start by checking the serving size on the Nutrition Facts label of packaged foods and drinks. The number of calories and the amount of carbs, fats, and other nutrients listed on the label are based on one serving of the item. Many items contain more than one serving per package. ?Check the total grams (g) of carbs in one serving. ?Check the number of grams of saturated fats and trans fats in one serving. Choose foods that have a low amount or none of these fats. ?Check the number  of milligrams (mg) of salt (sodium) in one serving. Most people should limit total sodium intake to less than 2,300 mg per day. ?Always check the nutrition information of foods labeled as "low-fat" or "nonfat." These foods may be higher in added sugar or refined carbs and should be avoided. ?Talk to your dietitian to identify your daily goals for nutrients listed on the label. ?Shopping ?Avoid buying canned, pre-made, or processed foods. These foods tend to be high in fat, sodium, and added sugar. ?Shop around the outside edge of the grocery store. This  is where you will most often find fresh fruits and vegetables, bulk grains, fresh meats, and fresh dairy products. ?Cooking ?Use low-heat cooking methods, such as baking, instead of high-heat cooking methods, such as deep frying. ?Cook using healthy oils, such as olive, canola, or sunflower oil. ?Avoid cooking with butter, cream, or high-fat meats. ?Meal planning ?Eat meals and snacks regularly, preferably at the same times every day. Avoid going long periods of time without eating. ?Eat foods that are high in fiber, such as fresh fruits, vegetables, beans, and whole grains. ?Eat 4-6 oz (112-168 g) of lean protein each day, such as lean meat, chicken, fish, eggs, or tofu. One ounce (oz) (28 g) of lean protein is equal to: ?1 oz (28 g) of meat, chicken, or fish. ?1 egg. ?? cup (62 g) of tofu. ?Eat some foods each day that contain healthy fats, such as avocado, nuts, seeds, and fish. ?What foods should I eat? ?Fruits ?Berries. Apples. Oranges. Peaches. Apricots. Plums. Grapes. Mangoes. Papayas. Pomegranates. Kiwi. Cherries. ?Vegetables ?Leafy greens, including lettuce, spinach, kale, chard, collard greens, mustard greens, and cabbage. Beets. Cauliflower. Broccoli. Carrots. Green beans. Tomatoes. Peppers. Onions. Cucumbers. Brussels sprouts. ?Grains ?Whole grains, such as whole-wheat or whole-grain bread, crackers, tortillas, cereal, and pasta. Unsweetened oatmeal. Quinoa. Brown or wild rice. ?Meats and other proteins ?Seafood. Poultry without skin. Lean cuts of poultry and beef. Tofu. Nuts. Seeds. ?Dairy ?Low-fat or fat-free dairy products such as milk, yogurt, and cheese. ?The items listed above may not be a complete list of foods and beverages you can eat and drink. Contact a dietitian for more information. ?What foods should I avoid? ?Fruits ?Fruits canned with syrup. ?Vegetables ?Canned vegetables. Frozen vegetables with butter or cream sauce. ?Grains ?Refined white flour and flour products such as bread, pasta,  snack foods, and cereals. Avoid all processed foods. ?Meats and other proteins ?Fatty cuts of meat. Poultry with skin. Breaded or fried meats. Processed meat. Avoid saturated fats. ?Dairy ?Full-fat yogurt, cheese, or milk. ?Beverages ?Sweetened drinks, such as soda or iced tea. ?The items listed above may not be a complete list of foods and beverages you should avoid. Contact a dietitian for more information. ?Questions to ask a health care provider ?Do I need to meet with a certified diabetes care and education specialist? ?Do I need to meet with a dietitian? ?What number can I call if I have questions? ?When are the best times to check my blood glucose? ?Where to find more information: ?American Diabetes Association: diabetes.org ?Academy of Nutrition and Dietetics: eatright.org ?[employer] of Diabetes and Digestive and Kidney Diseases: AmenCredit.is ?Association of Diabetes Care & Education Specialists: diabeteseducator.org ?Summary ?It is important to have healthy eating habits because your blood sugar (glucose) levels are greatly affected by what you eat and drink. It is important to use alcohol carefully. ?A healthy meal plan will help you manage your blood glucose and lower your risk of heart disease. ?Your health  care provider may recommend that you work with a dietitian to make a meal plan that is best for you. ?This information is not intended to replace advice given to you by your health care provider. Make sure you discuss any questions you have with your health care provider. ?Document Revised: 12/25/2019 Document Reviewed: 12/25/2019 ?Elsevier Patient Education ? [place]. ? ?

## 2021-10-22 ENCOUNTER — Telehealth: Payer: Self-pay

## 2021-10-22 NOTE — Telephone Encounter (Signed)
CALLED PATIENT NO ANSWER LEFT VOICEMAIL FOR A CALL BACK ? ?

## 2021-10-25 ENCOUNTER — Other Ambulatory Visit: Payer: Self-pay

## 2021-10-25 DIAGNOSIS — Z86010 Personal history of colonic polyps: Secondary | ICD-10-CM

## 2021-10-25 MED ORDER — PEG 3350-KCL-NA BICARB-NACL 420 G PO SOLR: 4000.0000 mL | Freq: Once | ORAL | 0 refills | Status: AC

## 2021-10-25 NOTE — Progress Notes (Signed)
Gastroenterology Pre-Procedure Review  Request Date: 11/25/2021 Requesting Physician: Dr. Allen Norris   PATIENT REVIEW QUESTIONS: The patient responded to the following health history questions as indicated:    1. Are you having any GI issues? no 2. Do you have a personal history of Polyps? yes (last colonocopy ) 3. Do you have a family history of Colon Cancer or Polyps? yes (dad colon cancer) 4. Diabetes Mellitus? no 5. Joint replacements in the past 12 months?no 6. Major health problems in the past 3 months?no 7. Any artificial heart valves, MVP, or defibrillator?no    MEDICATIONS & ALLERGIES:    Patient reports the following regarding taking any anticoagulation/antiplatelet therapy:   Plavix, Coumadin, Eliquis, Xarelto, Lovenox, Pradaxa, Brilinta, or Effient? no Aspirin? no  Patient confirms/reports the following medications:  Current Outpatient Medications  Medication Sig Dispense Refill   amLODipine (NORVASC) 10 MG tablet Take 1 tablet (10 mg total) by mouth daily. 90 tablet 3   carbamide peroxide (DEBROX) 6.5 % OTIC solution Place 10 drops into the right ear 2 (two) times daily. To right ear 15 mL 0   ketoconazole (NIZORAL) 2 % cream Apply 1 application topically 2 (two) times daily as needed for irritation. 60 g 11   losartan-hydrochlorothiazide (HYZAAR) 50-12.5 MG tablet Take 1 tablet by mouth daily. D/c hctz 12.5 alone 90 tablet 3   metFORMIN (GLUCOPHAGE) 500 MG tablet Take 1 tablet (500 mg total) by mouth daily with breakfast. 90 tablet 3   nicotine (NICODERM CQ - DOSED IN MG/24 HOURS) 14 mg/24hr patch Place 1 patch (14 mg total) onto the skin daily. 30 patch 0   nicotine (NICODERM CQ - DOSED IN MG/24 HOURS) 21 mg/24hr patch Place 1 patch (21 mg total) onto the skin daily. Then 14 mg qd x 30 days x 7 mg x daily x 30 days 30 patch 0   nicotine (NICODERM CQ - DOSED IN MG/24 HR) 7 mg/24hr patch Place 1 patch (7 mg total) onto the skin daily. 30 patch 0   omeprazole (PRILOSEC) 40 MG  capsule Take 1 capsule (40 mg total) by mouth daily. 30 minutes before food 90 capsule 3   rosuvastatin (CRESTOR) 20 MG tablet Take 1 tablet (20 mg total) by mouth at bedtime. 90 tablet 3   sildenafil (REVATIO) 20 MG tablet Take 1-5 tablets (20-100 mg total) by mouth daily as needed. 30 minutes to 4 hours as needed before sex 30 tablet 5   No current facility-administered medications for this visit.    Patient confirms/reports the following allergies:  Allergies  Allergen Reactions   Lisinopril     Cough     No orders of the defined types were placed in this encounter.   AUTHORIZATION INFORMATION Primary Insurance: 1D#: Group #:  Secondary Insurance: 1D#: Group #:  SCHEDULE INFORMATION: Date: 11/25/2021 Time: Location: [facility]

## 2021-11-18 ENCOUNTER — Other Ambulatory Visit: Payer: Self-pay

## 2021-11-18 MED ORDER — NA SULFATE-K SULFATE-MG SULF 17.5-3.13-1.6 GM/177ML PO SOLN: 1.0000 | Freq: Once | ORAL | 0 refills | Status: AC

## 2021-11-24 ENCOUNTER — Encounter: Payer: Self-pay | Admitting: Gastroenterology

## 2021-11-25 ENCOUNTER — Encounter: Payer: Self-pay | Admitting: Gastroenterology

## 2021-11-25 ENCOUNTER — Ambulatory Visit: Payer: 59 | Admitting: Certified Registered"

## 2021-11-25 ENCOUNTER — Other Ambulatory Visit: Payer: Self-pay

## 2021-11-25 ENCOUNTER — Ambulatory Visit
Admission: RE | Admit: 2021-11-25 | Discharge: 2021-11-25 | Disposition: A | Discharge status: Home / Self Care | Payer: 59 | Attending: Gastroenterology | Admitting: Gastroenterology

## 2021-11-25 ENCOUNTER — Encounter
Admission: RE | Disposition: A | Discharge status: Home / Self Care | Payer: Self-pay | Source: Home / Self Care | Attending: Gastroenterology

## 2021-11-25 DIAGNOSIS — K219 Gastro-esophageal reflux disease without esophagitis: Secondary | ICD-10-CM | POA: Insufficient documentation

## 2021-11-25 DIAGNOSIS — Z7984 Long term (current) use of oral hypoglycemic drugs: Secondary | ICD-10-CM | POA: Diagnosis not present

## 2021-11-25 DIAGNOSIS — Z860100 Personal history of colon polyps, unspecified: Secondary | ICD-10-CM

## 2021-11-25 DIAGNOSIS — Z79899 Other long term (current) drug therapy: Secondary | ICD-10-CM | POA: Diagnosis not present

## 2021-11-25 DIAGNOSIS — J449 Chronic obstructive pulmonary disease, unspecified: Secondary | ICD-10-CM | POA: Diagnosis not present

## 2021-11-25 DIAGNOSIS — Z8249 Family history of ischemic heart disease and other diseases of the circulatory system: Secondary | ICD-10-CM | POA: Insufficient documentation

## 2021-11-25 DIAGNOSIS — E785 Hyperlipidemia, unspecified: Secondary | ICD-10-CM | POA: Diagnosis not present

## 2021-11-25 DIAGNOSIS — E119 Type 2 diabetes mellitus without complications: Secondary | ICD-10-CM | POA: Insufficient documentation

## 2021-11-25 DIAGNOSIS — Z1211 Encounter for screening for malignant neoplasm of colon: Secondary | ICD-10-CM | POA: Diagnosis not present

## 2021-11-25 DIAGNOSIS — D124 Benign neoplasm of descending colon: Secondary | ICD-10-CM | POA: Insufficient documentation

## 2021-11-25 DIAGNOSIS — K635 Polyp of colon: Secondary | ICD-10-CM | POA: Diagnosis not present

## 2021-11-25 DIAGNOSIS — F17210 Nicotine dependence, cigarettes, uncomplicated: Secondary | ICD-10-CM | POA: Diagnosis not present

## 2021-11-25 DIAGNOSIS — K640 First degree hemorrhoids: Secondary | ICD-10-CM | POA: Diagnosis not present

## 2021-11-25 DIAGNOSIS — I10 Essential (primary) hypertension: Secondary | ICD-10-CM | POA: Insufficient documentation

## 2021-11-25 HISTORY — PX: COLONOSCOPY WITH PROPOFOL: SHX5780

## 2021-11-25 LAB — GLUCOSE, CAPILLARY: Glucose-Capillary: 87 mg/dL (ref 70–99)

## 2021-11-25 SURGERY — COLONOSCOPY WITH PROPOFOL
Anesthesia: General

## 2021-11-25 MED ORDER — SODIUM CHLORIDE 0.9 % IV SOLN: INTRAVENOUS | Status: DC

## 2021-11-25 MED ORDER — PROPOFOL 10 MG/ML IV BOLUS
INTRAVENOUS | Status: DC | PRN
  Administered 2021-11-25: 100 mg via INTRAVENOUS
  Administered 2021-11-25 (×4): 30 mg via INTRAVENOUS

## 2021-11-25 NOTE — Op Note (Signed)
[facility] Gastroenterology Patient Name: Anthony Harding Procedure Date: 11/25/2021 8:36 AM MRN: [id number] Account #: [id number] Date of Birth: 08/17/1960 Admit Type: Outpatient Age: 61 Room: [facility] ENDO ROOM 4 Gender: Male Note Status: Finalized Instrument Name: Jasper Riling [id number] Procedure:             Colonoscopy Indications:           High risk colon cancer surveillance: Personal history                         of colonic polyps Providers:             Lucilla Lame MD, MD Referring MD:          Nino Glow Mclean-Scocuzza MD, MD (Referring MD) Medicines:             Propofol per Anesthesia Complications:         No immediate complications. Procedure:             Pre-Anesthesia Assessment:                        - Prior to the procedure, a History and Physical was                         performed, and patient medications and allergies were                         reviewed. The patient's tolerance of previous                         anesthesia was also reviewed. The risks and benefits                         of the procedure and the sedation options and risks                         were discussed with the patient. All questions were                         answered, and informed consent was obtained. Prior                         Anticoagulants: The patient has taken no previous                         anticoagulant or antiplatelet agents. ASA Grade                         Assessment: II - A patient with mild systemic disease.                         After reviewing the risks and benefits, the patient                         was deemed in satisfactory condition to undergo the                         procedure.  After obtaining informed consent, the colonoscope was                         passed under direct vision. Throughout the procedure,                         the patient's blood pressure, pulse, and oxygen                          saturations were monitored continuously. The                         Colonoscope was introduced through the anus and                         advanced to the the cecum, identified by appendiceal                         orifice and ileocecal valve. The colonoscopy was                         performed without difficulty. The patient tolerated                         the procedure well. The quality of the bowel                         preparation was excellent. Findings:      The perianal and digital rectal examinations were normal.      Two sessile polyps were found in the descending colon. The polyps were 3       to 4 mm in size. These polyps were removed with a cold biopsy forceps.       Resection and retrieval were complete.      Two sessile polyps were found in the sigmoid colon. The polyps were 2 to       3 mm in size. These polyps were removed with a cold biopsy forceps.       Resection and retrieval were complete.      Non-bleeding internal hemorrhoids were found during retroflexion. The       hemorrhoids were Grade I (internal hemorrhoids that do not prolapse). Impression:            - Two 3 to 4 mm polyps in the descending colon,                         removed with a cold biopsy forceps. Resected and                         retrieved.                        - Two 2 to 3 mm polyps in the sigmoid colon, removed                         with a cold biopsy forceps. Resected and retrieved.                        - Non-bleeding internal hemorrhoids. Recommendation:        -  Discharge patient to home.                        - Resume previous diet.                        - Continue present medications.                        - Await pathology results.                        - Repeat colonoscopy in 7 years for surveillance. Procedure Code(s):     --- Professional ---                        [phone], Colonoscopy, flexible; with biopsy, single or                         multiple Diagnosis  Code(s):     --- Professional ---                        Z86.010, Personal history of colonic polyps                        K63.5, Polyp of colon CPT copyright 2019 American Medical Association. All rights reserved. The codes documented in this report are preliminary and upon coder review may  be revised to meet current compliance requirements. Lucilla Lame MD, MD 11/25/2021 9:00:11 AM This report has been signed electronically. Number of Addenda: 0 Note Initiated On: 11/25/2021 8:36 AM Scope Withdrawal Time: 0 hours 11 minutes 50 seconds  Total Procedure Duration: 0 hours 13 minutes 39 seconds  Estimated Blood Loss:  Estimated blood loss: none.      [facility]

## 2021-11-25 NOTE — H&P (Signed)
Anthony Lame, MD [address] [place],  [address] Phone:[phone] Fax : [phone]  Primary Care Physician:  McLean-Scocuzza, Nino Glow, MD Primary Gastroenterologist:  Dr. Allen Norris  Pre-Procedure History & Physical: HPI:  Anthony Harding is a 61 y.o. male is here for an colonoscopy.   Past Medical History:  Diagnosis Date   GERD (gastroesophageal reflux disease)    Hyperlipidemia    Hypertension    Low back pain     Past Surgical History:  Procedure Laterality Date   COLONOSCOPY WITH PROPOFOL N/A 01/31/2017   Procedure: COLONOSCOPY WITH PROPOFOL;  Surgeon: Anthony Lame, MD;  Location: [facility] ENDOSCOPY;  Service: Endoscopy;  Laterality: N/A;    Prior to Admission medications   Medication Sig Start Date End Date Taking? Authorizing Provider  amLODipine (NORVASC) 10 MG tablet Take 1 tablet (10 mg total) by mouth daily. 10/06/21  Yes McLean-Scocuzza, Nino Glow, MD  losartan-hydrochlorothiazide (HYZAAR) 50-12.5 MG tablet Take 1 tablet by mouth daily. D/c hctz 12.5 alone 06/16/21  Yes McLean-Scocuzza, Nino Glow, MD  metFORMIN (GLUCOPHAGE) 500 MG tablet Take 1 tablet (500 mg total) by mouth daily with breakfast. 10/06/21  Yes McLean-Scocuzza, Nino Glow, MD  omeprazole (PRILOSEC) 40 MG capsule Take 1 capsule (40 mg total) by mouth daily. 30 minutes before food 10/06/21  Yes McLean-Scocuzza, Nino Glow, MD  rosuvastatin (CRESTOR) 20 MG tablet Take 1 tablet (20 mg total) by mouth at bedtime. 10/06/21  Yes McLean-Scocuzza, Nino Glow, MD  carbamide peroxide (DEBROX) 6.5 % OTIC solution Place 10 drops into the right ear 2 (two) times daily. To right ear 10/20/21   McLean-Scocuzza, Nino Glow, MD  ketoconazole (NIZORAL) 2 % cream Apply 1 application topically 2 (two) times daily as needed for irritation. 02/23/18   McLean-Scocuzza, Nino Glow, MD  nicotine (NICODERM CQ - DOSED IN MG/24 HOURS) 14 mg/24hr patch Place 1 patch (14 mg total) onto the skin daily. Patient not taking: Reported on 11/25/2021  10/20/21   McLean-Scocuzza, Nino Glow, MD  nicotine (NICODERM CQ - DOSED IN MG/24 HOURS) 21 mg/24hr patch Place 1 patch (21 mg total) onto the skin daily. Then 14 mg qd x 30 days x 7 mg x daily x 30 days Patient not taking: Reported on 11/25/2021 10/20/21   McLean-Scocuzza, Nino Glow, MD  nicotine (NICODERM CQ - DOSED IN MG/24 HR) 7 mg/24hr patch Place 1 patch (7 mg total) onto the skin daily. Patient not taking: Reported on 11/25/2021 10/20/21   McLean-Scocuzza, Nino Glow, MD  sildenafil (REVATIO) 20 MG tablet Take 1-5 tablets (20-100 mg total) by mouth daily as needed. 30 minutes to 4 hours as needed before sex 10/20/21   McLean-Scocuzza, Nino Glow, MD    Allergies as of 10/25/2021 - Review Complete 10/25/2021  Allergen Reaction Noted   Lisinopril  03/13/2019    Family History  Problem Relation Age of Onset   Hypertension Mother    Heart attack Mother    Hypertension Father    Cancer Father        prostate     Social History   Socioeconomic History   Marital status: Married    Spouse name: Not on file   Number of children: Not on file   Years of education: Not on file   Highest education level: Not on file  Occupational History   Not on file  Tobacco Use   Smoking status: Every Day    Packs/day: 1.00    Years: 30.00    Total pack  years: 30.00    Types: Cigarettes   Smokeless tobacco: Never   Tobacco comments:    only smokes about a half pack a day now  Vaping Use   Vaping Use: Never used  Substance and Sexual Activity   Alcohol use: Yes    Comment: socially   Drug use: No   Sexual activity: Not on file  Other Topics Concern   Not on file  Social History Narrative   From [place]    Married 27 years as of 03/2019    9 kids 5 girls and 4 boys    Employed    Social Determinants of Radio broadcast assistant Strain: Not on file  Food Insecurity: Not on file  Transportation Needs: Not on file  Physical Activity: Not on file  Stress: Not on file  Social Connections: Not  on file  Intimate Partner Violence: Not on file    Review of Systems: See HPI, otherwise negative ROS  Physical Exam: BP (!) 142/79   Pulse 63   Temp (!) 96.3 F (35.7 C) (Temporal)   Resp 18   Ht '6\' 2"'$  (1.88 m)   Wt 83.9 kg   SpO2 100%   BMI 23.75 kg/m  General:   Alert,  pleasant and cooperative in NAD Head:  Normocephalic and atraumatic. Neck:  Supple; no masses or thyromegaly. Lungs:  Clear throughout to auscultation.    Heart:  Regular rate and rhythm. Abdomen:  Soft, nontender and nondistended. Normal bowel sounds, without guarding, and without rebound.   Neurologic:  Alert and  oriented x4;  grossly normal neurologically.  Impression/Plan: [name] is here for an colonoscopy to be performed for a history of adenomatous polyps on 2018   Risks, benefits, limitations, and alternatives regarding  colonoscopy have been reviewed with the patient.  Questions have been answered.  All parties agreeable.   Anthony Lame, MD  11/25/2021, 8:13 AM

## 2021-11-25 NOTE — Transfer of Care (Signed)
Immediate Anesthesia Transfer of Care Note  Patient: Anthony Harding  Procedure(s) Performed: COLONOSCOPY WITH PROPOFOL  Patient Location: PACU and Endoscopy Unit  Anesthesia Type:General  Level of Consciousness: drowsy and patient cooperative  Airway & Oxygen Therapy: Patient Spontanous Breathing and Patient connected to nasal cannula oxygen  Post-op Assessment: Report given to RN and Post -op Vital signs reviewed and stable  Post vital signs: Reviewed and stable  Last Vitals:  Vitals Value Taken Time  BP 98/73 11/25/21 0904  Temp 36.2 C 11/25/21 0904  Pulse 57 11/25/21 0905  Resp 14 11/25/21 0905  SpO2 100 % 11/25/21 0905  Vitals shown include unvalidated device data.  Last Pain:  Vitals:   11/25/21 0904  TempSrc: Temporal  PainSc: Asleep         Complications: No notable events documented.

## 2021-11-25 NOTE — Anesthesia Preprocedure Evaluation (Signed)
Anesthesia Evaluation  Patient identified by MRN, date of birth, ID band Patient awake    Reviewed: Allergy & Precautions, H&P , NPO status , Patient's Chart, lab work & pertinent test results, reviewed documented beta blocker date and time   Airway Mallampati: II   Neck ROM: full    Dental  (+) Poor Dentition   Pulmonary COPD, Current SmokerPatient did not abstain from smoking.,    Pulmonary exam normal        Cardiovascular Exercise Tolerance: Good hypertension, On Medications negative cardio ROS Normal cardiovascular exam Rhythm:regular Rate:Normal     Neuro/Psych negative neurological ROS  negative psych ROS   GI/Hepatic Neg liver ROS, GERD  Medicated,  Endo/Other  diabetes, Well Controlled, Type 2, Oral Hypoglycemic Agents  Renal/GU negative Renal ROS  negative genitourinary   Musculoskeletal   Abdominal   Peds  Hematology negative hematology ROS (+)   Anesthesia Other Findings Past Medical History: No date: GERD (gastroesophageal reflux disease) No date: Hyperlipidemia No date: Hypertension No date: Low back pain Past Surgical History: 01/31/2017: COLONOSCOPY WITH PROPOFOL; N/A     Comment:  Procedure: COLONOSCOPY WITH PROPOFOL;  Surgeon: Lucilla Lame, MD;  Location: [facility] ENDOSCOPY;  Service:               Endoscopy;  Laterality: N/A; BMI    Body Mass Index: 23.75 kg/m     Reproductive/Obstetrics negative OB ROS                             Anesthesia Physical Anesthesia Plan  ASA: 3  Anesthesia Plan: General   Post-op Pain Management:    Induction:   PONV Risk Score and Plan:   Airway Management Planned:   Additional Equipment:   Intra-op Plan:   Post-operative Plan:   Informed Consent: I have reviewed the patients History and Physical, chart, labs and discussed the procedure including the risks, benefits and alternatives for the proposed  anesthesia with the patient or authorized representative who has indicated his/her understanding and acceptance.     Dental Advisory Given  Plan Discussed with: CRNA  Anesthesia Plan Comments:         Anesthesia Quick Evaluation

## 2021-11-26 ENCOUNTER — Encounter: Payer: Self-pay | Admitting: Gastroenterology

## 2021-11-26 LAB — SURGICAL PATHOLOGY

## 2021-11-28 ENCOUNTER — Encounter: Payer: Self-pay | Admitting: Gastroenterology

## 2021-12-01 NOTE — Anesthesia Postprocedure Evaluation (Signed)
Anesthesia Post Note  Patient: JAQUARIUS SEDER  Procedure(s) Performed: COLONOSCOPY WITH PROPOFOL  Patient location during evaluation: PACU Anesthesia Type: General Level of consciousness: awake and alert Pain management: pain level controlled Vital Signs Assessment: post-procedure vital signs reviewed and stable Respiratory status: spontaneous breathing, nonlabored ventilation, respiratory function stable and patient connected to nasal cannula oxygen Cardiovascular status: blood pressure returned to baseline and stable Postop Assessment: no apparent nausea or vomiting Anesthetic complications: no   No notable events documented.   Last Vitals:  Vitals:   11/25/21 0914 11/25/21 0924  BP: 104/62 126/73  Pulse: (!) 58 (!) 49  Resp: 15 15  Temp:    SpO2: 99% 100%    Last Pain:  Vitals:   11/26/21 0743  TempSrc:   PainSc: 0-No pain                 Molli Barrows

## 2022-01-20 ENCOUNTER — Other Ambulatory Visit (INDEPENDENT_AMBULATORY_CARE_PROVIDER_SITE_OTHER): Payer: 59

## 2022-01-20 DIAGNOSIS — E559 Vitamin D deficiency, unspecified: Secondary | ICD-10-CM

## 2022-01-20 DIAGNOSIS — I152 Hypertension secondary to endocrine disorders: Secondary | ICD-10-CM

## 2022-01-20 DIAGNOSIS — E1159 Type 2 diabetes mellitus with other circulatory complications: Secondary | ICD-10-CM

## 2022-01-20 LAB — CBC WITH DIFFERENTIAL/PLATELET
Basophils Absolute: 0 10*3/uL (ref 0.0–0.1)
Basophils Relative: 0.4 % (ref 0.0–3.0)
Eosinophils Absolute: 0.2 10*3/uL (ref 0.0–0.7)
Eosinophils Relative: 2.3 % (ref 0.0–5.0)
HCT: 40.2 % (ref 39.0–52.0)
Hemoglobin: 13.1 g/dL (ref 13.0–17.0)
Lymphocytes Relative: 26.6 % (ref 12.0–46.0)
Lymphs Abs: 2.1 10*3/uL (ref 0.7–4.0)
MCHC: 32.5 g/dL (ref 30.0–36.0)
MCV: 82.7 fl (ref 78.0–100.0)
Monocytes Absolute: 0.7 10*3/uL (ref 0.1–1.0)
Monocytes Relative: 9.3 % (ref 3.0–12.0)
Neutro Abs: 4.8 10*3/uL (ref 1.4–7.7)
Neutrophils Relative %: 61.4 % (ref 43.0–77.0)
Platelets: 198 10*3/uL (ref 150.0–400.0)
RBC: 4.87 Mil/uL (ref 4.22–5.81)
RDW: 14.5 % (ref 11.5–15.5)
WBC: 7.8 10*3/uL (ref 4.0–10.5)

## 2022-01-20 LAB — LIPID PANEL
Cholesterol: 97 mg/dL (ref 0–200)
HDL: 39.1 mg/dL (ref >39.00)
LDL Cholesterol: 40 mg/dL (ref 0–99)
NonHDL: 57.71
Total CHOL/HDL Ratio: 2
Triglycerides: 88 mg/dL (ref 0.0–149.0)
VLDL: 17.6 mg/dL (ref 0.0–40.0)

## 2022-01-20 LAB — COMPREHENSIVE METABOLIC PANEL
ALT: 15 U/L (ref 0–53)
AST: 16 U/L (ref 0–37)
Albumin: 4.5 g/dL (ref 3.5–5.2)
Alkaline Phosphatase: 85 U/L (ref 39–117)
BUN: 15 mg/dL (ref 6–23)
CO2: 28 mEq/L (ref 19–32)
Calcium: 9.2 mg/dL (ref 8.4–10.5)
Chloride: 101 mEq/L (ref 96–112)
Creatinine, Ser: 1.17 mg/dL (ref 0.40–1.50)
GFR: 66.62 mL/min (ref >60.00)
Glucose, Bld: 116 mg/dL — ABNORMAL HIGH (ref 70–99)
Potassium: 4 mEq/L (ref 3.5–5.1)
Sodium: 139 mEq/L (ref 135–145)
Total Bilirubin: 0.5 mg/dL (ref 0.2–1.2)
Total Protein: 7.1 g/dL (ref 6.0–8.3)

## 2022-01-20 LAB — VITAMIN D 25 HYDROXY (VIT D DEFICIENCY, FRACTURES): VITD: 40.37 ng/mL (ref 30.00–100.00)

## 2022-01-20 LAB — HEMOGLOBIN A1C: Hgb A1c MFr Bld: 6.8 % — ABNORMAL HIGH (ref 4.6–6.5)

## 2022-01-24 ENCOUNTER — Telehealth: Payer: Self-pay

## 2022-01-24 NOTE — Telephone Encounter (Signed)
LMOM for pt to CB in regards to labs   McLean-Scocuzza, Nino Glow, MD  Gracy Racer, CMA A1c 6.8 from 6.7 continue metformin still at goal <7.0 for diabetes but make sure eating healthy diet choices and exercising   Vitamin D normal   Liver kidneys normal   Blood counts normal   Cholesterol normal

## 2022-03-15 ENCOUNTER — Ambulatory Visit: Payer: 59 | Admitting: Internal Medicine

## 2022-08-19 ENCOUNTER — Ambulatory Visit: Admission: RE | Admit: 2022-08-19 | Payer: 59 | Source: Ambulatory Visit

## 2022-09-26 ENCOUNTER — Emergency Department: Payer: 59

## 2022-09-26 ENCOUNTER — Encounter: Payer: Self-pay | Admitting: Intensive Care

## 2022-09-26 ENCOUNTER — Other Ambulatory Visit: Payer: Self-pay

## 2022-09-26 ENCOUNTER — Emergency Department
Admission: EM | Admit: 2022-09-26 | Discharge: 2022-09-26 | Disposition: A | Discharge status: Home / Self Care | Payer: 59 | Attending: Emergency Medicine | Admitting: Emergency Medicine

## 2022-09-26 DIAGNOSIS — L03115 Cellulitis of right lower limb: Secondary | ICD-10-CM

## 2022-09-26 DIAGNOSIS — E119 Type 2 diabetes mellitus without complications: Secondary | ICD-10-CM

## 2022-09-26 DIAGNOSIS — Z87891 Personal history of nicotine dependence: Secondary | ICD-10-CM | POA: Insufficient documentation

## 2022-09-26 DIAGNOSIS — M7989 Other specified soft tissue disorders: Secondary | ICD-10-CM | POA: Diagnosis present

## 2022-09-26 DIAGNOSIS — E785 Hyperlipidemia, unspecified: Secondary | ICD-10-CM

## 2022-09-26 DIAGNOSIS — I10 Essential (primary) hypertension: Secondary | ICD-10-CM

## 2022-09-26 HISTORY — DX: Type 2 diabetes mellitus without complications: E11.9

## 2022-09-26 MED ORDER — METFORMIN HCL 500 MG PO TABS: 500.0000 mg | ORAL_TABLET | Freq: Every day | ORAL | 3 refills | Status: AC

## 2022-09-26 MED ORDER — CEPHALEXIN 500 MG PO CAPS: 500.0000 mg | ORAL_CAPSULE | Freq: Four times a day (QID) | ORAL | 0 refills | Status: AC

## 2022-09-26 MED ORDER — AMLODIPINE BESYLATE 10 MG PO TABS: 10.0000 mg | ORAL_TABLET | Freq: Every day | ORAL | 3 refills | Status: AC

## 2022-09-26 MED ORDER — ROSUVASTATIN CALCIUM 20 MG PO TABS: 20.0000 mg | ORAL_TABLET | Freq: Every day | ORAL | 3 refills | Status: AC

## 2022-09-26 NOTE — ED Notes (Signed)
Patient is in US at this time

## 2022-09-26 NOTE — ED Triage Notes (Signed)
Patient presents with right calf swelling since Friday.

## 2022-09-26 NOTE — ED Notes (Signed)
Pt in room at this time  

## 2022-09-26 NOTE — ED Provider Notes (Signed)
[facility] Provider Note    Event Date/Time   First MD Initiated Contact with Patient 09/26/22 [id number]     (approximate)   History   Leg Swelling   HPI  Anthony Harding is a 62 y.o. male with history of hypertension, hyperlipidemia and diabetes presents emergency department with right leg calf swelling.  Also has a lot of redness in the area.  States he is unsure if something bit him.  No chest pain or shortness of breath.  Patient is a smoker.  Symptoms have been ongoing since Friday.     Physical Exam   Triage Vital Signs: ED Triage Vitals  Enc Vitals Group     BP 09/26/22 0926 (!) 150/89     Pulse Rate 09/26/22 0926 74     Resp 09/26/22 0926 16     Temp 09/26/22 0926 98.9 F (37.2 C)     Temp Source 09/26/22 0926 Oral     SpO2 09/26/22 0926 99 %     Weight 09/26/22 0927 196 lb (88.9 kg)     Height 09/26/22 0927  (1.88 m)     Head Circumference --      Peak Flow --      Pain Score 09/26/22 0927 7     Pain Loc --      Pain Edu? --      Excl. in GC? --     Most recent vital signs: Vitals:   09/26/22 0926  BP: (!) 150/89  Pulse: 74  Resp: 16  Temp: 98.9 F (37.2 C)  SpO2: 99%     General: Awake, no distress.   CV:  Good peripheral perfusion. regular rate and  rhythm Resp:  Normal effort. Lungs cta Abd:  No distention.   Other:  Right lower extremity is red warm painful to touch, swelling noted when compared to the left, neurovascular appears to be intact   ED Results / Procedures / Treatments   Labs (all labs ordered are listed, but only abnormal results are displayed) Labs Reviewed - No data to display   EKG     RADIOLOGY Ultrasound right lower extremity    PROCEDURES:   Procedures   MEDICATIONS ORDERED IN ED: Medications - No data to display   IMPRESSION / MDM / ASSESSMENT AND PLAN / ED COURSE  I reviewed the triage vital signs and the nursing notes.                              Differential  diagnosis includes, but is not limited to, DVT, cellulitis, insect bite  Patient's presentation is most consistent with acute presentation with potential threat to life or bodily function.   Ultrasound right lower extremity for DVT was independently reviewed and interpreted by me as being negative for any acute abnormality  Do feel this is more of a cellulitis at this time.  Patient was given a prescription for Keflex 4 times daily for 7 days.  However strict instructions to return the emergency department if he is not improving within 1 week.  May need a repeat ultrasound in 7 days.  Patient is in agreement treatment plan.  We did discuss smoking cessation.  He was discharged stable condition.  Also refills on his chronic hypertension.  States he has not been able to see his doctor as his insurance changed recently.      FINAL CLINICAL IMPRESSION(S) / ED DIAGNOSES  Final diagnoses:  Cellulitis of right lower extremity     Rx / DC Orders   ED Discharge Orders          Ordered    amLODipine (NORVASC) 10 MG tablet  Daily        09/26/22 1030    metFORMIN (GLUCOPHAGE) 500 MG tablet  Daily with breakfast       Note to Pharmacy: D/c lisinopril   09/26/22 1030    rosuvastatin (CRESTOR) 20 MG tablet  Daily at bedtime       Note to Pharmacy: Generic ok   09/26/22 1030    cephALEXin (KEFLEX) 500 MG capsule  4 times daily        09/26/22 1030             Note:  This document was prepared using Dragon voice recognition software and may include unintentional dictation errors.    Faythe Ghee, PA-C 09/26/22 1215    Jene Every, MD 09/26/22 1225

## 2022-10-05 ENCOUNTER — Encounter: Payer: 59 | Admitting: Nurse Practitioner

## 2023-02-17 ENCOUNTER — Encounter (INDEPENDENT_AMBULATORY_CARE_PROVIDER_SITE_OTHER): Payer: Self-pay | Admitting: Vascular Surgery

## 2023-03-14 ENCOUNTER — Encounter (INDEPENDENT_AMBULATORY_CARE_PROVIDER_SITE_OTHER): Payer: Self-pay | Admitting: Vascular Surgery

## 2023-03-14 ENCOUNTER — Ambulatory Visit (INDEPENDENT_AMBULATORY_CARE_PROVIDER_SITE_OTHER): Payer: 59 | Admitting: Vascular Surgery

## 2023-03-14 VITALS — BP 138/84 | HR 80 | Resp 18 | Ht 74.0 in | Wt 188.2 lb

## 2023-03-14 DIAGNOSIS — E785 Hyperlipidemia, unspecified: Secondary | ICD-10-CM

## 2023-03-14 DIAGNOSIS — M7989 Other specified soft tissue disorders: Secondary | ICD-10-CM | POA: Diagnosis not present

## 2023-03-14 DIAGNOSIS — I10 Essential (primary) hypertension: Secondary | ICD-10-CM

## 2023-03-14 DIAGNOSIS — E119 Type 2 diabetes mellitus without complications: Secondary | ICD-10-CM

## 2023-03-14 NOTE — Assessment & Plan Note (Signed)
lipid control important in reducing the progression of atherosclerotic disease. Continue statin therapy  

## 2023-03-14 NOTE — Progress Notes (Signed)
Patient ID: Anthony Harding, male   DOB: 1960-07-16, 62 y.o.   MRN: [id number]  Chief Complaint  Patient presents with   New Patient (Initial Visit)    NP. consult. RLE edema. whitaker    HPI Anthony Harding is a 62 y.o. male.  I am asked to see the patient by Debbra Riding for evaluation of right leg swelling.  This has been going on now for about 6 months.  It started back in April.  There were no clear causes or inciting events.  He thinks he may have had a spider bite on that leg.  No fevers or chills.  No previous history of DVT or superficial thrombophlebitis to his knowledge.  No significant left leg symptoms.  He has used compression socks without seeing much improvement.  He does not have open wounds or ulceration.  No chest pain or shortness of breath he has had no major surgeries or injuries on that leg in the past.     Past Medical History:  Diagnosis Date   Diabetes mellitus without complication (HCC)    GERD (gastroesophageal reflux disease)    Hyperlipidemia    Hypertension    Low back pain     Past Surgical History:  Procedure Laterality Date   COLONOSCOPY WITH PROPOFOL N/A 01/31/2017   Procedure: COLONOSCOPY WITH PROPOFOL;  Surgeon: Midge Minium, MD;  Location: [facility] ENDOSCOPY;  Service: Endoscopy;  Laterality: N/A;   COLONOSCOPY WITH PROPOFOL N/A 11/25/2021   Procedure: COLONOSCOPY WITH PROPOFOL;  Surgeon: Midge Minium, MD;  Location: [facility] ENDOSCOPY;  Service: Endoscopy;  Laterality: N/A;     Family History  Problem Relation Age of Onset   Hypertension Mother    Heart attack Mother    Hypertension Father    Cancer Father        prostate       Social History   Tobacco Use   Smoking status: Every Day    Current packs/day: 1.00    Average packs/day: 1 pack/day for 30.0 years (30.0 ttl pk-yrs)    Types: Cigarettes   Smokeless tobacco: Never   Tobacco comments:    only smokes about a half pack a day now  Vaping Use   Vaping status: Never Used   Substance Use Topics   Alcohol use: Yes    Alcohol/week: 6.0 standard drinks of alcohol    Types: 1 Cans of beer, 5 Shots of liquor per week   Drug use: No     Allergies  Allergen Reactions   Lisinopril     Cough     Current Outpatient Medications  Medication Sig Dispense Refill   amLODipine (NORVASC) 10 MG tablet Take 1 tablet (10 mg total) by mouth daily. 90 tablet 3   Cholecalciferol (D 1000) 25 MCG (1000 UT) capsule Take 1,000 Units by mouth daily.     losartan-hydrochlorothiazide (HYZAAR) 50-12.5 MG tablet Take 1 tablet by mouth daily. D/c hctz 12.5 alone 90 tablet 3   metFORMIN (GLUCOPHAGE) 500 MG tablet Take 1 tablet (500 mg total) by mouth daily with breakfast. 90 tablet 3   omeprazole (PRILOSEC) 40 MG capsule Take 1 capsule (40 mg total) by mouth daily. 30 minutes before food 90 capsule 3   rosuvastatin (CRESTOR) 20 MG tablet Take 1 tablet (20 mg total) by mouth at bedtime. 90 tablet 3   ketoconazole (NIZORAL) 2 % cream Apply 1 application topically 2 (two) times daily as needed for irritation. 60 g 11   sildenafil (  REVATIO) 20 MG tablet Take 1-5 tablets (20-100 mg total) by mouth daily as needed. 30 minutes to 4 hours as needed before sex (Patient not taking: Reported on 03/14/2023) 30 tablet 5   No current facility-administered medications for this visit.      REVIEW OF SYSTEMS (Negative unless checked)  Constitutional: [] Weight loss  [] Fever  [] Chills Cardiac: [] Chest pain   [] Chest pressure   [] Palpitations   [] Shortness of breath when laying flat   [] Shortness of breath at rest   [] Shortness of breath with exertion. Vascular:  [] Pain in legs with walking   [] Pain in legs at rest   [] Pain in legs when laying flat   [] Claudication   [] Pain in feet when walking  [] Pain in feet at rest  [] Pain in feet when laying flat   [] History of DVT   [] Phlebitis   [x] Swelling in legs   [] Varicose veins   [] Non-healing ulcers Pulmonary:   [] Uses home oxygen   [] Productive cough    [] Hemoptysis   [] Wheeze  [] COPD   [] Asthma Neurologic:  [] Dizziness  [] Blackouts   [] Seizures   [] History of stroke   [] History of TIA  [] Aphasia   [] Temporary blindness   [] Dysphagia   [] Weakness or numbness in arms   [] Weakness or numbness in legs Musculoskeletal:  [] Arthritis   [] Joint swelling   [] Joint pain   [x] Low back pain Hematologic:  [] Easy bruising  [] Easy bleeding   [] Hypercoagulable state   [] Anemic  [] Hepatitis Gastrointestinal:  [] Blood in stool   [] Vomiting blood  [x] Gastroesophageal reflux/heartburn   [] Abdominal pain Genitourinary:  [] Chronic kidney disease   [] Difficult urination  [] Frequent urination  [] Burning with urination   [] Hematuria Skin:  [] Rashes   [] Ulcers   [] Wounds Psychological:  [] History of anxiety   []  History of major depression.    Physical Exam BP 138/84 (BP Location: Left Arm)   Pulse 80   Resp 18   Ht 6\' 2"  (1.88 m)   Wt 188 lb 3.2 oz (85.4 kg)   BMI 24.16 kg/m  Gen:  WD/WN, NAD.  Appears younger than stated age Head: [place]/AT, No temporalis wasting. Prominent temp pulse not noted. Ear/Nose/Throat: Hearing grossly intact, nares w/o erythema or drainage, oropharynx w/o Erythema/Exudate Eyes: Conjunctiva clear, sclera non-icteric  Neck: trachea midline.  No JVD.  Pulmonary:  Good air movement, respirations not labored, no use of accessory muscles  Cardiac: RRR, no JVD Vascular:  Vessel Right Left  Radial Palpable Palpable                          DP 1+ 2+  PT NP 2+   Gastrointestinal:. No masses, surgical incisions, or scars. Musculoskeletal: M/S 5/5 throughout.  Extremities without ischemic changes.  No deformity or atrophy. 1-2+ RLE edema. Neurologic: Sensation grossly intact in extremities.  Symmetrical.  Speech is fluent. Motor exam as listed above. Psychiatric: Judgment intact, Mood & affect appropriate for pt's clinical situation. Dermatologic: No rashes or ulcers noted.  No cellulitis or open wounds.    Radiology No results  found.  Labs No results found for this or any previous visit (from the past 2160 hour(s)).  Assessment/Plan:  Swelling of limb Recommend:  I have had a long discussion with the patient regarding swelling and why it  causes symptoms.  Patient will begin wearing graduated compression on a daily basis. The patient will  wear the stockings first thing in the morning and removing them in the evening. The  patient is instructed specifically not to sleep in the stockings.   In addition, behavioral modification will be initiated.  This will include frequent elevation, use of over the counter pain medications and exercise such as walking.  Consideration for a lymph pump will also be made based upon the effectiveness of conservative therapy.  This would help to improve the edema control and prevent sequela such as ulcers and infections   Patient should undergo duplex ultrasound of the venous system to ensure that DVT or reflux is not present.  The patient will follow-up with me after the ultrasound.   Hyperlipidemia lipid control important in reducing the progression of atherosclerotic disease. Continue statin therapy   Type 2 diabetes mellitus without complication, without long-term current use of insulin (HCC) blood glucose control important in reducing the progression of atherosclerotic disease. Also, involved in wound healing. On appropriate medications.   Essential hypertension blood pressure control important in reducing the progression of atherosclerotic disease. On appropriate oral medications.      Festus Barren 03/14/2023, 3:03 PM   This note was created with Dragon medical transcription system.  Any errors from dictation are unintentional.

## 2023-03-14 NOTE — Assessment & Plan Note (Signed)
blood pressure control important in reducing the progression of atherosclerotic disease. On appropriate oral medications.  

## 2023-03-14 NOTE — Assessment & Plan Note (Signed)
blood glucose control important in reducing the progression of atherosclerotic disease. Also, involved in wound healing. On appropriate medications.  

## 2023-03-14 NOTE — Assessment & Plan Note (Signed)
Recommend:  I have had a long discussion with the patient regarding swelling and why it  causes symptoms.  Patient will begin wearing graduated compression on a daily basis. The patient will  wear the stockings first thing in the morning and removing them in the evening. The patient is instructed specifically not to sleep in the stockings.   In addition, behavioral modification will be initiated.  This will include frequent elevation, use of over the counter pain medications and exercise such as walking.  Consideration for a lymph pump will also be made based upon the effectiveness of conservative therapy.  This would help to improve the edema control and prevent sequela such as ulcers and infections   Patient should undergo duplex ultrasound of the venous system to ensure that DVT or reflux is not present.  The patient will follow-up with me after the ultrasound.   

## 2023-04-21 ENCOUNTER — Ambulatory Visit (INDEPENDENT_AMBULATORY_CARE_PROVIDER_SITE_OTHER): Payer: 59

## 2023-04-21 ENCOUNTER — Ambulatory Visit (INDEPENDENT_AMBULATORY_CARE_PROVIDER_SITE_OTHER): Payer: 59 | Admitting: Nurse Practitioner

## 2023-04-21 ENCOUNTER — Encounter (INDEPENDENT_AMBULATORY_CARE_PROVIDER_SITE_OTHER): Payer: Self-pay | Admitting: Nurse Practitioner

## 2023-04-21 VITALS — BP 135/82 | HR 76 | Resp 16 | Wt 191.4 lb

## 2023-04-21 DIAGNOSIS — I890 Lymphedema, not elsewhere classified: Secondary | ICD-10-CM

## 2023-04-21 DIAGNOSIS — I10 Essential (primary) hypertension: Secondary | ICD-10-CM | POA: Diagnosis not present

## 2023-04-21 DIAGNOSIS — M7989 Other specified soft tissue disorders: Secondary | ICD-10-CM

## 2023-04-21 DIAGNOSIS — E119 Type 2 diabetes mellitus without complications: Secondary | ICD-10-CM

## 2023-05-01 ENCOUNTER — Encounter (INDEPENDENT_AMBULATORY_CARE_PROVIDER_SITE_OTHER): Payer: Self-pay | Admitting: Nurse Practitioner

## 2023-05-01 NOTE — Progress Notes (Signed)
Subjective:    Patient ID: Anthony Harding, male    DOB: 1960/07/29, 62 y.o.   MRN: [id number] Chief Complaint  Patient presents with   Follow-up    Ultrasound follow up    Anthony Harding is a 62 y.o. male.  I am asked to see the patient by Anthony Harding for evaluation of right leg swelling.  This has been going on now for about 6 months.  It started back in April.  There were no clear causes or inciting events.  He thinks he may have had a spider bite on that leg.  No fevers or chills.  No previous history of DVT or superficial thrombophlebitis to his knowledge.  No significant left leg symptoms.  He has used compression socks without seeing much improvement.  He does not have open wounds or ulceration.  No chest pain or shortness of breath he has had no major surgeries or injuries on that leg in the past.  Today noninvasive study showed no evidence of DVT or superficial Vitas bilaterally.  No evidence of deep venous insufficiency or superficial venous reflux noted bilaterally.    Review of Systems  Cardiovascular:  Positive for leg swelling.  All other systems reviewed and are negative.      Objective:   Physical Exam Vitals reviewed.  HENT:     Head: Normocephalic.  Cardiovascular:     Rate and Rhythm: Normal rate.     Pulses: Normal pulses.  Pulmonary:     Effort: Pulmonary effort is normal.  Skin:    General: Skin is warm and dry.  Neurological:     Mental Status: He is alert and oriented to person, place, and time.  Psychiatric:        Mood and Affect: Mood normal.        Behavior: Behavior normal.        Thought Content: Thought content normal.        Judgment: Judgment normal.     BP 135/82 (BP Location: Left Arm)   Pulse 76   Resp 16   Wt 191 lb 6.4 oz (86.8 kg)   BMI 24.57 kg/m   Past Medical History:  Diagnosis Date   Diabetes mellitus without complication (HCC)    GERD (gastroesophageal reflux disease)    Hyperlipidemia    Hypertension    Low  back pain     Social History   Socioeconomic History   Marital status: Married    Spouse name: Not on file   Number of children: Not on file   Years of education: Not on file   Highest education level: Not on file  Occupational History   Not on file  Tobacco Use   Smoking status: Every Day    Current packs/day: 1.00    Average packs/day: 1 pack/day for 30.0 years (30.0 ttl pk-yrs)    Types: Cigarettes   Smokeless tobacco: Never   Tobacco comments:    only smokes about a half pack a day now  Vaping Use   Vaping status: Never Used  Substance and Sexual Activity   Alcohol use: Yes    Alcohol/week: 6.0 standard drinks of alcohol    Types: 1 Cans of beer, 5 Shots of liquor per week   Drug use: No   Sexual activity: Not on file  Other Topics Concern   Not on file  Social History Narrative   From [place]    Married 27 years as of 03/2019  9 kids 5 girls and 4 boys    Employed    Social Determinants of Health   Financial Resource Strain: Low Risk  (11/01/2022)   Received from [facility], [facility]   Overall Financial Resource Strain (CARDIA)    Difficulty of Paying Living Expenses: Not hard at all  Food Insecurity: No Food Insecurity (11/01/2022)   Received from [facility], [facility]   Hunger Vital Sign    Worried About Running Out of Food in the Last Year: Never true    Ran Out of Food in the Last Year: Never true  Transportation Needs: No Transportation Needs (11/01/2022)   Received from [facility], [facility]   [facility] - Transportation    In the past 12 months, has lack of transportation kept you from medical appointments or from getting medications?: No    Lack of Transportation (Non-Medical): No  Physical Activity: Not on file  Stress: Not on file  Social Connections: Not on file  Intimate Partner Violence: Not on file    Past Surgical History:   Procedure Laterality Date   COLONOSCOPY WITH PROPOFOL N/A 01/31/2017   Procedure: COLONOSCOPY WITH PROPOFOL;  Surgeon: Midge Minium, MD;  Location: [facility] ENDOSCOPY;  Service: Endoscopy;  Laterality: N/A;   COLONOSCOPY WITH PROPOFOL N/A 11/25/2021   Procedure: COLONOSCOPY WITH PROPOFOL;  Surgeon: Midge Minium, MD;  Location: [facility] ENDOSCOPY;  Service: Endoscopy;  Laterality: N/A;    Family History  Problem Relation Age of Onset   Hypertension Mother    Heart attack Mother    Hypertension Father    Cancer Father        prostate     Allergies  Allergen Reactions   Lisinopril     Cough        Latest Ref Rng & Units 01/20/2022    7:44 AM 06/15/2021    9:42 AM 10/16/2020    8:40 AM  CBC  WBC 4.0 - 10.5 K/uL 7.8  6.4  6.1   Hemoglobin 13.0 - 17.0 g/dL 16.1  09.6  04.5   Hematocrit 39.0 - 52.0 % 40.2  42.7  43.1   Platelets 150.0 - 400.0 K/uL 198.0  186.0  181.0       CMP     Component Value Date/Time   NA 139 01/20/2022 0744   NA 140 04/20/2016 0932   NA 139 12/31/2012 1724   K 4.0 01/20/2022 0744   K 3.6 12/31/2012 1724   CL 101 01/20/2022 0744   CL 105 12/31/2012 1724   CO2 28 01/20/2022 0744   CO2 31 12/31/2012 1724   GLUCOSE 116 (H) 01/20/2022 0744   GLUCOSE 88 12/31/2012 1724   BUN 15 01/20/2022 0744   BUN 14 04/20/2016 0932   BUN 11 12/31/2012 1724   CREATININE 1.17 01/20/2022 0744   CREATININE 1.21 12/31/2012 1724   CALCIUM 9.2 01/20/2022 0744   CALCIUM 9.6 12/31/2012 1724   PROT 7.1 01/20/2022 0744   ALBUMIN 4.5 01/20/2022 0744   AST 16 01/20/2022 0744   ALT 15 01/20/2022 0744   ALKPHOS 85 01/20/2022 0744   BILITOT 0.5 01/20/2022 0744   GFR 66.62 01/20/2022 0744   GFRNONAA 75 04/20/2016 0932   GFRNONAA >60 12/31/2012 1724     No results found.     Assessment & Plan:   1. Lymphedema The patient's swelling has been very minimal.  Following discussion with the patient I do suspect  it was likely spider bite or some sort of irritation that causes  swelling and that have been out of the blue.  He will continue with conservative therapy of utilizing compression and elevation.  Will have her return in 6 months to evaluate progress of the edema or sooner if issues arise.  Patient should follow-up in six months   2. Essential hypertension Continue antihypertensive medications as already ordered, these medications have been reviewed and there are no changes at this time.  3. Type 2 diabetes mellitus without complication, without long-term current use of insulin (HCC) Continue hypoglycemic medications as already ordered, these medications have been reviewed and there are no changes at this time.  Hgb A1C to be monitored as already arranged by primary service   Current Outpatient Medications on File Prior to Visit  Medication Sig Dispense Refill   amLODipine (NORVASC) 10 MG tablet Take 1 tablet (10 mg total) by mouth daily. 90 tablet 3   Cholecalciferol (D 1000) 25 MCG (1000 UT) capsule Take 1,000 Units by mouth daily.     ketoconazole (NIZORAL) 2 % cream Apply 1 application topically 2 (two) times daily as needed for irritation. 60 g 11   losartan-hydrochlorothiazide (HYZAAR) 50-12.5 MG tablet Take 1 tablet by mouth daily. D/c hctz 12.5 alone 90 tablet 3   metFORMIN (GLUCOPHAGE) 500 MG tablet Take 1 tablet (500 mg total) by mouth daily with breakfast. 90 tablet 3   omeprazole (PRILOSEC) 40 MG capsule Take 1 capsule (40 mg total) by mouth daily. 30 minutes before food 90 capsule 3   rosuvastatin (CRESTOR) 20 MG tablet Take 1 tablet (20 mg total) by mouth at bedtime. 90 tablet 3   sildenafil (REVATIO) 20 MG tablet Take 1-5 tablets (20-100 mg total) by mouth daily as needed. 30 minutes to 4 hours as needed before sex (Patient not taking: Reported on 03/14/2023) 30 tablet 5   No current facility-administered medications on file prior to visit.    There are no Patient Instructions on file for this visit. No follow-ups on file.   Georgiana Spinner, NP

## 2023-06-15 ENCOUNTER — Encounter: Payer: Self-pay | Admitting: Acute Care

## 2023-10-20 ENCOUNTER — Ambulatory Visit (INDEPENDENT_AMBULATORY_CARE_PROVIDER_SITE_OTHER): Payer: 59 | Admitting: Nurse Practitioner

## 2023-10-24 ENCOUNTER — Encounter (INDEPENDENT_AMBULATORY_CARE_PROVIDER_SITE_OTHER): Payer: Self-pay

## 2023-10-25 ENCOUNTER — Encounter: Payer: Self-pay | Admitting: Urology

## 2023-10-25 ENCOUNTER — Ambulatory Visit: Admitting: Urology

## 2023-10-25 VITALS — BP 144/76 | HR 77 | Ht 74.0 in | Wt 189.0 lb

## 2023-10-25 DIAGNOSIS — N5201 Erectile dysfunction due to arterial insufficiency: Secondary | ICD-10-CM | POA: Diagnosis not present

## 2023-10-25 MED ORDER — TADALAFIL 20 MG PO TABS: ORAL_TABLET | ORAL | 0 refills | Status: AC

## 2023-10-25 NOTE — Progress Notes (Signed)
 I, Maysun Jamey Mccallum, acting as a Neurosurgeon for Geraline Knapp, MD., have documented all relevant documentation on the behalf of Geraline Knapp, MD, as directed by Geraline Knapp, MD while in the presence of Geraline Knapp, MD.  Discussed the use of AI scribe software for clinical note transcription with the patient, who gave verbal consent to proceed.   10/25/2023 4:36 PM   Anthony Harding March 21, 1961 [id number]  Referring provider: Lenell Query, PA-C [address] [place],  [place] [address]  Chief Complaint  Patient presents with   Erectile Dysfunction    HPI: Anthony Harding is a 63 y.o. male referred for evaluation of erectile dysfunction.  1 year history of difficulty achieving an erection. He has erections which are firm enough for penetration much less than 50% of the time. Able to maintain erection after penetration much less than 50% of the time. Intercourse has been satisfactory much less than 50% of the time.  SHIM was 11/25, indicating moderate ED.  Organic risk factors include hypertension, antihypertensive medications, diabetes, and hyperlipidemia. Former smoker with a 30-year history, less than half a pack per day.  Denies pain or curvature with erections.  Prescription for sildenafil  20mg  was dispensed in 2023, though he does not remember taking.    PMH: Past Medical History:  Diagnosis Date   Diabetes mellitus without complication (HCC)    GERD (gastroesophageal reflux disease)    Hyperlipidemia    Hypertension    Low back pain     Surgical History: Past Surgical History:  Procedure Laterality Date   COLONOSCOPY WITH PROPOFOL  N/A 01/31/2017   Procedure: COLONOSCOPY WITH PROPOFOL ;  Surgeon: Marnee Sink, MD;  Location: [facility] ENDOSCOPY;  Service: Endoscopy;  Laterality: N/A;   COLONOSCOPY WITH PROPOFOL  N/A 11/25/2021   Procedure: COLONOSCOPY WITH PROPOFOL ;  Surgeon: Marnee Sink, MD;  Location: [facility] ENDOSCOPY;  Service: Endoscopy;   Laterality: N/A;    Home Medications:  Allergies as of 10/25/2023       Reactions   Lisinopril     Cough        Medication List        Accurate as of Oct 25, 2023  4:36 PM. If you have any questions, ask your nurse or doctor.          amLODipine  10 MG tablet Commonly known as: Norvasc  Take 1 tablet (10 mg total) by mouth daily.   D 1000 25 MCG (1000 UT) capsule Generic drug: Cholecalciferol  Take 1,000 Units by mouth daily.   ketoconazole  2 % cream Commonly known as: NIZORAL  Apply 1 application topically 2 (two) times daily as needed for irritation.   losartan -hydrochlorothiazide  50-12.5 MG tablet Commonly known as: HYZAAR Take 1 tablet by mouth daily. D/c hctz 12.5 alone   metFORMIN  500 MG tablet Commonly known as: GLUCOPHAGE  Take 1 tablet (500 mg total) by mouth daily with breakfast.   omeprazole  40 MG capsule Commonly known as: PRILOSEC Take 1 capsule (40 mg total) by mouth daily. 30 minutes before food   rosuvastatin  20 MG tablet Commonly known as: Crestor  Take 1 tablet (20 mg total) by mouth at bedtime.   sildenafil  20 MG tablet Commonly known as: REVATIO  Take 1-5 tablets (20-100 mg total) by mouth daily as needed. 30 minutes to 4 hours as needed before sex   tadalafil 20 MG tablet Commonly known as: CIALIS One hour prior to intercourse Started by: Geraline Knapp        Allergies:  Allergies  Allergen Reactions   Lisinopril      Cough     Family History: Family History  Problem Relation Age of Onset   Hypertension Mother    Heart attack Mother    Hypertension Father    Cancer Father        prostate     Social History:  reports that he has been smoking cigarettes. He has a 30 pack-year smoking history. He has never used smokeless tobacco. He reports current alcohol use of about 6.0 standard drinks of alcohol per week. He reports that he does not use drugs.   Physical Exam: BP (!) 144/76   Pulse 77   Ht 6\' 2"  (1.88 m)   Wt 189  lb (85.7 kg)   BMI 24.27 kg/m   Constitutional:  Alert and oriented, No acute distress. HEENT: [place] AT Respiratory: Normal respiratory effort, no increased work of breathing. GU: Phallus uncircumcised without lesion. Testes descend bilaterally with estimated volume of 20 cc bilaterally. No corporal plaques. Psychiatric: Normal mood and affect.   Assessment & Plan:    1. Erectile dysfunction Significant organic risk factors.  Trial tadalafil 20 mg PO 1 hour prior to intercourse.  Recommend he try at least on 5 occasions. Second-line options of intracavernosal injectionsand vacuum-irrigation devices were discussed.  I have reviewed the above documentation for accuracy and completeness, and I agree with the above.   Geraline Knapp, MD  [facility] [address] [place], [place] [address] [phone]

## 2024-02-19 ENCOUNTER — Emergency Department
Admission: EM | Admit: 2024-02-19 | Discharge: 2024-02-19 | Disposition: A | Discharge status: Home / Self Care | Attending: Emergency Medicine | Admitting: Emergency Medicine

## 2024-02-19 ENCOUNTER — Emergency Department

## 2024-02-19 ENCOUNTER — Other Ambulatory Visit: Payer: Self-pay

## 2024-02-19 DIAGNOSIS — F17210 Nicotine dependence, cigarettes, uncomplicated: Secondary | ICD-10-CM | POA: Diagnosis not present

## 2024-02-19 DIAGNOSIS — M545 Low back pain, unspecified: Secondary | ICD-10-CM | POA: Insufficient documentation

## 2024-02-19 DIAGNOSIS — M25512 Pain in left shoulder: Secondary | ICD-10-CM | POA: Insufficient documentation

## 2024-02-19 DIAGNOSIS — Y92410 Unspecified street and highway as the place of occurrence of the external cause: Secondary | ICD-10-CM | POA: Diagnosis not present

## 2024-02-19 DIAGNOSIS — E119 Type 2 diabetes mellitus without complications: Secondary | ICD-10-CM | POA: Insufficient documentation

## 2024-02-19 DIAGNOSIS — I10 Essential (primary) hypertension: Secondary | ICD-10-CM | POA: Insufficient documentation

## 2024-02-19 DIAGNOSIS — M542 Cervicalgia: Secondary | ICD-10-CM | POA: Diagnosis present

## 2024-02-19 DIAGNOSIS — M7918 Myalgia, other site: Secondary | ICD-10-CM

## 2024-02-19 MED ORDER — NAPROXEN 500 MG PO TABS: 500.0000 mg | ORAL_TABLET | Freq: Two times a day (BID) | ORAL | 0 refills | Status: AC
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
